# Patient Record
Sex: Female | Born: 1945 | Race: White | Hispanic: No
Health system: Southern US, Community
[De-identification: ages and names within clinical notes are randomized; demographics above are authoritative.]

---

## 2006-01-14 ENCOUNTER — Emergency Department: Payer: Self-pay | Admitting: Emergency Medicine

## 2007-04-30 ENCOUNTER — Emergency Department: Payer: Self-pay | Admitting: Emergency Medicine

## 2007-10-14 ENCOUNTER — Ambulatory Visit: Payer: Self-pay | Admitting: Family Medicine

## 2010-02-14 ENCOUNTER — Ambulatory Visit: Payer: Self-pay | Admitting: Family Medicine

## 2010-02-21 ENCOUNTER — Ambulatory Visit: Payer: Self-pay | Admitting: Family Medicine

## 2010-05-16 ENCOUNTER — Ambulatory Visit: Payer: Self-pay | Admitting: Gastroenterology

## 2010-09-30 ENCOUNTER — Ambulatory Visit: Payer: Self-pay

## 2011-03-16 ENCOUNTER — Ambulatory Visit: Payer: Self-pay | Admitting: Family Medicine

## 2011-04-13 ENCOUNTER — Ambulatory Visit: Payer: Self-pay | Admitting: Family Medicine

## 2011-10-17 ENCOUNTER — Emergency Department: Payer: Self-pay | Admitting: Emergency Medicine

## 2011-10-18 ENCOUNTER — Emergency Department: Payer: Self-pay | Admitting: Unknown Physician Specialty

## 2012-02-11 ENCOUNTER — Inpatient Hospital Stay: Payer: Self-pay | Admitting: Internal Medicine

## 2012-02-11 LAB — LIPASE, BLOOD: Lipase: 1297 U/L — ABNORMAL HIGH (ref 73–393)

## 2012-02-11 LAB — URINALYSIS, COMPLETE
Bacteria: NONE SEEN
Glucose,UR: NEGATIVE mg/dL (ref 0–75)
Ketone: NEGATIVE
Leukocyte Esterase: NEGATIVE
Nitrite: NEGATIVE
Protein: NEGATIVE
RBC,UR: NONE SEEN /HPF (ref 0–5)
Specific Gravity: 1.004 (ref 1.003–1.030)
WBC UR: <1 /HPF (ref 0–5)

## 2012-02-11 LAB — CBC
HCT: 34.4 % — ABNORMAL LOW (ref 35.0–47.0)
MCV: 99 fL (ref 80–100)
Platelet: 338 10*3/uL (ref 150–440)
RBC: 3.48 10*6/uL — ABNORMAL LOW (ref 3.80–5.20)
RDW: 18.8 % — ABNORMAL HIGH (ref 11.5–14.5)

## 2012-02-11 LAB — COMPREHENSIVE METABOLIC PANEL
Albumin: 3.7 g/dL (ref 3.4–5.0)
Alkaline Phosphatase: 72 U/L (ref 50–136)
Co2: 26 mmol/L (ref 21–32)
Creatinine: 1.01 mg/dL (ref 0.60–1.30)
EGFR (African American): >60
Osmolality: 288 (ref 275–301)
SGOT(AST): 25 U/L (ref 15–37)
SGPT (ALT): 23 U/L
Total Protein: 7.1 g/dL (ref 6.4–8.2)

## 2012-02-11 LAB — TROPONIN I: Troponin-I: 0.04 ng/mL

## 2012-02-12 LAB — BASIC METABOLIC PANEL
Calcium, Total: 8.2 mg/dL — ABNORMAL LOW (ref 8.5–10.1)
Chloride: 108 mmol/L — ABNORMAL HIGH (ref 98–107)
Co2: 23 mmol/L (ref 21–32)
Creatinine: 1.15 mg/dL (ref 0.60–1.30)
EGFR (Non-African Amer.): 50 — ABNORMAL LOW
Glucose: 83 mg/dL (ref 65–99)
Potassium: 3.5 mmol/L (ref 3.5–5.1)
Sodium: 145 mmol/L (ref 136–145)

## 2012-02-12 LAB — CBC WITH DIFFERENTIAL/PLATELET
Basophil %: 0.1 %
Eosinophil #: 0.1 10*3/uL (ref 0.0–0.7)
HCT: 26.9 % — ABNORMAL LOW (ref 35.0–47.0)
HGB: 9 g/dL — ABNORMAL LOW (ref 12.0–16.0)
Lymphocyte #: 2 10*3/uL (ref 1.0–3.6)
MCH: 33.5 pg (ref 26.0–34.0)
MCHC: 33.5 g/dL (ref 32.0–36.0)
MCV: 100 fL (ref 80–100)
Monocyte #: 0.7 10*3/uL (ref 0.0–0.7)
Neutrophil #: 5.9 10*3/uL (ref 1.4–6.5)
WBC: 8.8 10*3/uL (ref 3.6–11.0)

## 2012-02-12 LAB — HEMOGLOBIN A1C: Hemoglobin A1C: 6.5 % — ABNORMAL HIGH (ref 4.2–6.3)

## 2012-02-12 LAB — LIPID PANEL
HDL Cholesterol: 29 mg/dL — ABNORMAL LOW (ref 40–60)
Ldl Cholesterol, Calc: 36 mg/dL (ref 0–100)
VLDL Cholesterol, Calc: 36 mg/dL (ref 5–40)

## 2012-02-12 LAB — MAGNESIUM: Magnesium: 0.8 mg/dL — ABNORMAL LOW

## 2012-02-13 LAB — COMPREHENSIVE METABOLIC PANEL
Albumin: 2.4 g/dL — ABNORMAL LOW (ref 3.4–5.0)
Alkaline Phosphatase: 51 U/L (ref 50–136)
Anion Gap: 13 (ref 7–16)
BUN: 17 mg/dL (ref 7–18)
Calcium, Total: 8 mg/dL — ABNORMAL LOW (ref 8.5–10.1)
Creatinine: 1.78 mg/dL — ABNORMAL HIGH (ref 0.60–1.30)
Glucose: 65 mg/dL (ref 65–99)
Osmolality: 283 (ref 275–301)
Potassium: 4 mmol/L (ref 3.5–5.1)
SGOT(AST): 41 U/L — ABNORMAL HIGH (ref 15–37)
SGPT (ALT): 19 U/L
Sodium: 142 mmol/L (ref 136–145)
Total Protein: 4.9 g/dL — ABNORMAL LOW (ref 6.4–8.2)

## 2012-02-14 LAB — CBC WITH DIFFERENTIAL/PLATELET
Basophil %: 0.3 %
Eosinophil #: 0.1 10*3/uL (ref 0.0–0.7)
Eosinophil %: 1.4 %
HCT: 23.3 % — ABNORMAL LOW (ref 35.0–47.0)
HGB: 7.7 g/dL — ABNORMAL LOW (ref 12.0–16.0)
Lymphocyte #: 1.2 10*3/uL (ref 1.0–3.6)
MCHC: 32.9 g/dL (ref 32.0–36.0)
MCV: 102 fL — ABNORMAL HIGH (ref 80–100)
Monocyte #: 0.2 10*3/uL (ref 0.0–0.7)
Neutrophil #: 7.8 10*3/uL — ABNORMAL HIGH (ref 1.4–6.5)
RBC: 2.28 10*6/uL — ABNORMAL LOW (ref 3.80–5.20)
WBC: 9.4 10*3/uL (ref 3.6–11.0)

## 2012-02-14 LAB — COMPREHENSIVE METABOLIC PANEL
Albumin: 2.1 g/dL — ABNORMAL LOW (ref 3.4–5.0)
Anion Gap: 17 — ABNORMAL HIGH (ref 7–16)
BUN: 18 mg/dL (ref 7–18)
Bilirubin,Total: 0.7 mg/dL (ref 0.2–1.0)
Calcium, Total: 7.6 mg/dL — ABNORMAL LOW (ref 8.5–10.1)
Chloride: 110 mmol/L — ABNORMAL HIGH (ref 98–107)
Creatinine: 1.5 mg/dL — ABNORMAL HIGH (ref 0.60–1.30)
EGFR (African American): 45 — ABNORMAL LOW
Osmolality: 285 (ref 275–301)
SGOT(AST): 39 U/L — ABNORMAL HIGH (ref 15–37)
SGPT (ALT): 22 U/L

## 2012-02-14 LAB — LIPASE, BLOOD: Lipase: 253 U/L (ref 73–393)

## 2012-02-15 LAB — BASIC METABOLIC PANEL
Anion Gap: 10 (ref 7–16)
Calcium, Total: 7.9 mg/dL — ABNORMAL LOW (ref 8.5–10.1)
Chloride: 113 mmol/L — ABNORMAL HIGH (ref 98–107)
Co2: 17 mmol/L — ABNORMAL LOW (ref 21–32)
EGFR (Non-African Amer.): >60
Glucose: 79 mg/dL (ref 65–99)
Osmolality: 279 (ref 275–301)
Potassium: 3.7 mmol/L (ref 3.5–5.1)

## 2012-02-15 LAB — CBC WITH DIFFERENTIAL/PLATELET
Basophil %: 0.4 %
Eosinophil %: 3.6 %
HCT: 25 % — ABNORMAL LOW (ref 35.0–47.0)
Lymphocyte #: 0.7 10*3/uL — ABNORMAL LOW (ref 1.0–3.6)
MCH: 32.6 pg (ref 26.0–34.0)
MCHC: 33.7 g/dL (ref 32.0–36.0)
MCV: 97 fL (ref 80–100)
Neutrophil %: 83.6 %
Platelet: 178 10*3/uL (ref 150–440)
RBC: 2.58 10*6/uL — ABNORMAL LOW (ref 3.80–5.20)

## 2012-02-16 LAB — LIPASE, BLOOD: Lipase: 64 U/L — ABNORMAL LOW (ref 73–393)

## 2012-02-17 LAB — BASIC METABOLIC PANEL
Anion Gap: 16 (ref 7–16)
Calcium, Total: 8.4 mg/dL — ABNORMAL LOW (ref 8.5–10.1)
Chloride: 110 mmol/L — ABNORMAL HIGH (ref 98–107)
Co2: 17 mmol/L — ABNORMAL LOW (ref 21–32)
Creatinine: 0.91 mg/dL (ref 0.60–1.30)
EGFR (African American): >60
EGFR (Non-African Amer.): >60
Osmolality: 288 (ref 275–301)
Potassium: 4.3 mmol/L (ref 3.5–5.1)

## 2012-02-17 LAB — HEMOGLOBIN: HGB: 9.6 g/dL — ABNORMAL LOW (ref 12.0–16.0)

## 2012-02-17 LAB — TROPONIN I: Troponin-I: 0.06 ng/mL — ABNORMAL HIGH

## 2012-02-18 LAB — CBC WITH DIFFERENTIAL/PLATELET
Basophil #: 0 10*3/uL (ref 0.0–0.1)
Eosinophil #: 0 10*3/uL (ref 0.0–0.7)
HCT: 26.4 % — ABNORMAL LOW (ref 35.0–47.0)
Lymphocyte #: 0.9 10*3/uL — ABNORMAL LOW (ref 1.0–3.6)
Lymphocyte %: 10.8 %
MCHC: 34.4 g/dL (ref 32.0–36.0)
Monocyte #: 0.8 10*3/uL — ABNORMAL HIGH (ref 0.0–0.7)
Monocyte %: 10.3 %
Neutrophil #: 6.5 10*3/uL (ref 1.4–6.5)
Neutrophil %: 78.6 %
Platelet: 239 10*3/uL (ref 150–440)
RDW: 21.9 % — ABNORMAL HIGH (ref 11.5–14.5)
WBC: 8.3 10*3/uL (ref 3.6–11.0)

## 2012-02-18 LAB — COMPREHENSIVE METABOLIC PANEL
Albumin: 2.2 g/dL — ABNORMAL LOW (ref 3.4–5.0)
Alkaline Phosphatase: 55 U/L (ref 50–136)
Anion Gap: 17 — ABNORMAL HIGH (ref 7–16)
BUN: 19 mg/dL — ABNORMAL HIGH (ref 7–18)
Bilirubin,Total: 0.4 mg/dL (ref 0.2–1.0)
Calcium, Total: 8.2 mg/dL — ABNORMAL LOW (ref 8.5–10.1)
Chloride: 110 mmol/L — ABNORMAL HIGH (ref 98–107)
Creatinine: 1 mg/dL (ref 0.60–1.30)
EGFR (African American): >60
EGFR (Non-African Amer.): 59 — ABNORMAL LOW
Glucose: 92 mg/dL (ref 65–99)
Potassium: 3.7 mmol/L (ref 3.5–5.1)
SGOT(AST): 26 U/L (ref 15–37)
Total Protein: 5.4 g/dL — ABNORMAL LOW (ref 6.4–8.2)

## 2012-02-18 LAB — APTT: Activated PTT: 131.8 secs — ABNORMAL HIGH (ref 23.6–35.9)

## 2012-02-18 LAB — CK-MB: CK-MB: 1 ng/mL (ref 0.5–3.6)

## 2012-02-19 LAB — MRSA PCR SCREENING

## 2012-02-19 LAB — OCCULT BLOOD X 1 CARD TO LAB, STOOL: Occult Blood, Feces: NEGATIVE

## 2012-02-20 LAB — LIPASE, BLOOD: Lipase: 238 U/L (ref 73–393)

## 2012-02-21 LAB — CBC WITH DIFFERENTIAL/PLATELET
Basophil #: 0 10*3/uL (ref 0.0–0.1)
Basophil %: 0.3 %
Eosinophil %: 0 %
HCT: 26.7 % — ABNORMAL LOW (ref 35.0–47.0)
HGB: 9 g/dL — ABNORMAL LOW (ref 12.0–16.0)
Lymphocyte #: 1.3 10*3/uL (ref 1.0–3.6)
Lymphocyte %: 16.2 %
MCH: 32.3 pg (ref 26.0–34.0)
MCV: 96 fL (ref 80–100)
Monocyte %: 1.6 %
Neutrophil #: 6.7 10*3/uL — ABNORMAL HIGH (ref 1.4–6.5)
RBC: 2.8 10*6/uL — ABNORMAL LOW (ref 3.80–5.20)
WBC: 8.2 10*3/uL (ref 3.6–11.0)

## 2012-02-21 LAB — BASIC METABOLIC PANEL
Anion Gap: 10 (ref 7–16)
Calcium, Total: 7.8 mg/dL — ABNORMAL LOW (ref 8.5–10.1)
Chloride: 107 mmol/L (ref 98–107)
Co2: 23 mmol/L (ref 21–32)
EGFR (African American): >60
EGFR (Non-African Amer.): 55 — ABNORMAL LOW
Glucose: 109 mg/dL — ABNORMAL HIGH (ref 65–99)
Sodium: 140 mmol/L (ref 136–145)

## 2012-02-21 LAB — LIPASE, BLOOD: Lipase: 198 U/L (ref 73–393)

## 2012-03-18 ENCOUNTER — Ambulatory Visit: Payer: Self-pay | Admitting: Internal Medicine

## 2012-03-19 LAB — COMPREHENSIVE METABOLIC PANEL
Alkaline Phosphatase: 88 U/L (ref 50–136)
Anion Gap: 14 (ref 7–16)
BUN: 92 mg/dL — ABNORMAL HIGH (ref 7–18)
Bilirubin,Total: 0.5 mg/dL (ref 0.2–1.0)
Co2: 21 mmol/L (ref 21–32)
Glucose: 103 mg/dL — ABNORMAL HIGH (ref 65–99)
Osmolality: 315 (ref 275–301)
Potassium: 5.4 mmol/L — ABNORMAL HIGH (ref 3.5–5.1)
Sodium: 144 mmol/L (ref 136–145)
Total Protein: 6.8 g/dL (ref 6.4–8.2)

## 2012-03-19 LAB — CBC
HCT: 25.5 % — ABNORMAL LOW (ref 35.0–47.0)
HGB: 8.6 g/dL — ABNORMAL LOW (ref 12.0–16.0)
MCH: 33.9 pg (ref 26.0–34.0)
MCV: 101 fL — ABNORMAL HIGH (ref 80–100)
Platelet: 253 10*3/uL (ref 150–440)
WBC: 9.3 10*3/uL (ref 3.6–11.0)

## 2012-03-19 LAB — TROPONIN I: Troponin-I: <0.02 ng/mL

## 2012-03-20 ENCOUNTER — Inpatient Hospital Stay: Payer: Self-pay | Admitting: Internal Medicine

## 2012-03-20 LAB — BASIC METABOLIC PANEL
Calcium, Total: 8.2 mg/dL — ABNORMAL LOW (ref 8.5–10.1)
Chloride: 111 mmol/L — ABNORMAL HIGH (ref 98–107)
Co2: 17 mmol/L — ABNORMAL LOW (ref 21–32)
Osmolality: 306 (ref 275–301)
Potassium: 4.7 mmol/L (ref 3.5–5.1)
Sodium: 141 mmol/L (ref 136–145)

## 2012-03-20 LAB — URINALYSIS, COMPLETE
Hyaline Cast: 13
Ph: 5 (ref 4.5–8.0)
Squamous Epithelial: <1

## 2012-03-20 LAB — CREATININE, SERUM
Creatinine: 2.48 mg/dL — ABNORMAL HIGH (ref 0.60–1.30)
EGFR (African American): 25 — ABNORMAL LOW
EGFR (Non-African Amer.): 21 — ABNORMAL LOW

## 2012-03-21 LAB — CBC WITH DIFFERENTIAL/PLATELET
Basophil #: 0 10*3/uL (ref 0.0–0.1)
Eosinophil #: 0.4 10*3/uL (ref 0.0–0.7)
Eosinophil %: 8 %
HCT: 17.9 % — ABNORMAL LOW (ref 35.0–47.0)
HCT: 30.6 % — ABNORMAL LOW (ref 35.0–47.0)
HGB: 5.9 g/dL — ABNORMAL LOW (ref 12.0–16.0)
HGB: 10.5 g/dL — ABNORMAL LOW (ref 12.0–16.0)
Lymphocyte #: 1.1 10*3/uL (ref 1.0–3.6)
Lymphocyte #: 1.8 10*3/uL (ref 1.0–3.6)
Lymphocyte %: 18 %
Lymphocyte %: 29.7 %
MCH: 33.3 pg (ref 26.0–34.0)
MCH: 33.5 pg (ref 26.0–34.0)
MCHC: 32.9 g/dL (ref 32.0–36.0)
MCHC: 34.3 g/dL (ref 32.0–36.0)
MCV: 101 fL — ABNORMAL HIGH (ref 80–100)
MCV: 98 fL (ref 80–100)
Monocyte #: 0.5 10*3/uL (ref 0.0–0.7)
Monocyte %: 6.2 %
Neutrophil #: 4.2 10*3/uL (ref 1.4–6.5)
Neutrophil #: 3.5 10*3/uL (ref 1.4–6.5)
Neutrophil %: 66 %
Neutrophil %: 57.2 %
Platelet: 189 10*3/uL (ref 150–440)
RBC: 1.77 10*6/uL — ABNORMAL LOW (ref 3.80–5.20)
RBC: 3.13 10*6/uL — ABNORMAL LOW (ref 3.80–5.20)
RDW: 26.8 % — ABNORMAL HIGH (ref 11.5–14.5)
RDW: 17.3 % — ABNORMAL HIGH (ref 11.5–14.5)
WBC: 6.1 10*3/uL (ref 3.6–11.0)

## 2012-03-21 LAB — BASIC METABOLIC PANEL
BUN: 58 mg/dL — ABNORMAL HIGH (ref 7–18)
Calcium, Total: 7.5 mg/dL — ABNORMAL LOW (ref 8.5–10.1)
Chloride: 109 mmol/L — ABNORMAL HIGH (ref 98–107)
Co2: 27 mmol/L (ref 21–32)
Creatinine: 1.92 mg/dL — ABNORMAL HIGH (ref 0.60–1.30)
EGFR (African American): 34 — ABNORMAL LOW
EGFR (Non-African Amer.): 28 — ABNORMAL LOW
Osmolality: 307 (ref 275–301)
Potassium: 3.6 mmol/L (ref 3.5–5.1)
Sodium: 145 mmol/L (ref 136–145)

## 2012-03-21 LAB — FIBRIN DEGRADATION PROD.(ARMC ONLY): Fibrin Degradation Prod.: <10 (ref 2.1–7.7)

## 2012-03-21 LAB — FERRITIN: Ferritin (ARMC): 650 ng/mL — ABNORMAL HIGH (ref 8–388)

## 2012-03-21 LAB — HEPATIC FUNCTION PANEL A (ARMC)
Albumin: 2 g/dL — ABNORMAL LOW (ref 3.4–5.0)
Bilirubin, Direct: 0.1 mg/dL (ref 0.00–0.20)
Bilirubin,Total: 0.4 mg/dL (ref 0.2–1.0)
SGPT (ALT): 14 U/L

## 2012-03-21 LAB — PROTIME-INR
INR: 1.2
Prothrombin Time: 16 secs — ABNORMAL HIGH (ref 11.5–14.7)

## 2012-03-21 LAB — FIBRINOGEN: Fibrinogen: 402 mg/dL (ref 210–470)

## 2012-03-22 LAB — BASIC METABOLIC PANEL
Anion Gap: 9 (ref 7–16)
Chloride: 110 mmol/L — ABNORMAL HIGH (ref 98–107)
Creatinine: 1.25 mg/dL (ref 0.60–1.30)
EGFR (African American): 56 — ABNORMAL LOW
EGFR (Non-African Amer.): 46 — ABNORMAL LOW
Osmolality: 306 (ref 275–301)
Potassium: 3.3 mmol/L — ABNORMAL LOW (ref 3.5–5.1)
Sodium: 148 mmol/L — ABNORMAL HIGH (ref 136–145)

## 2012-03-22 LAB — CBC WITH DIFFERENTIAL/PLATELET
Basophil #: 0 10*3/uL (ref 0.0–0.1)
Eosinophil %: 6.8 %
HCT: 32.7 % — ABNORMAL LOW (ref 35.0–47.0)
HGB: 11 g/dL — ABNORMAL LOW (ref 12.0–16.0)
Lymphocyte #: 1.4 10*3/uL (ref 1.0–3.6)
Monocyte %: 6.3 %
Neutrophil %: 62.9 %
Platelet: 137 10*3/uL — ABNORMAL LOW (ref 150–440)
RDW: 17.7 % — ABNORMAL HIGH (ref 11.5–14.5)
WBC: 5.9 10*3/uL (ref 3.6–11.0)

## 2012-03-22 LAB — IRON AND TIBC
Iron Bind.Cap.(Total): 103 ug/dL — ABNORMAL LOW (ref 250–450)
Iron Saturation: 97 %
Iron: 100 ug/dL (ref 50–170)
Unbound Iron-Bind.Cap.: 3 ug/dL

## 2012-03-22 LAB — URINE CULTURE

## 2012-03-23 ENCOUNTER — Ambulatory Visit: Payer: Self-pay | Admitting: Neurology

## 2012-03-23 LAB — CBC WITH DIFFERENTIAL/PLATELET
Basophil %: 0.2 %
Eosinophil #: 0.3 10*3/uL (ref 0.0–0.7)
Eosinophil %: 4.5 %
HCT: 31.3 % — ABNORMAL LOW (ref 35.0–47.0)
HGB: 10.7 g/dL — ABNORMAL LOW (ref 12.0–16.0)
Lymphocyte #: 0.9 10*3/uL — ABNORMAL LOW (ref 1.0–3.6)
Lymphocyte %: 13.8 %
MCV: 98 fL (ref 80–100)
Monocyte #: 0.5 10*3/uL (ref 0.0–0.7)
Monocyte %: 8 %
Neutrophil #: 5 10*3/uL (ref 1.4–6.5)
Neutrophil %: 73.5 %
Platelet: 120 10*3/uL — ABNORMAL LOW (ref 150–440)
RBC: 3.18 10*6/uL — ABNORMAL LOW (ref 3.80–5.20)

## 2012-03-23 LAB — BASIC METABOLIC PANEL
Anion Gap: 9 (ref 7–16)
Calcium, Total: 7.1 mg/dL — ABNORMAL LOW (ref 8.5–10.1)
Chloride: 115 mmol/L — ABNORMAL HIGH (ref 98–107)
Co2: 26 mmol/L (ref 21–32)
EGFR (African American): 50 — ABNORMAL LOW
Glucose: 160 mg/dL — ABNORMAL HIGH (ref 65–99)
Potassium: 3.2 mmol/L — ABNORMAL LOW (ref 3.5–5.1)

## 2012-03-24 LAB — BASIC METABOLIC PANEL
Anion Gap: 10 (ref 7–16)
BUN: 34 mg/dL — ABNORMAL HIGH (ref 7–18)
Calcium, Total: 7 mg/dL — CL (ref 8.5–10.1)
Chloride: 117 mmol/L — ABNORMAL HIGH (ref 98–107)
Chloride: 116 mmol/L — ABNORMAL HIGH (ref 98–107)
Co2: 23 mmol/L (ref 21–32)
Co2: 23 mmol/L (ref 21–32)
Creatinine: 1.8 mg/dL — ABNORMAL HIGH (ref 0.60–1.30)
EGFR (African American): 38 — ABNORMAL LOW
EGFR (African American): 36 — ABNORMAL LOW
Glucose: 100 mg/dL — ABNORMAL HIGH (ref 65–99)
Glucose: 131 mg/dL — ABNORMAL HIGH (ref 65–99)
Osmolality: 306 (ref 275–301)
Osmolality: 306 (ref 275–301)
Potassium: 3.2 mmol/L — ABNORMAL LOW (ref 3.5–5.1)
Sodium: 150 mmol/L — ABNORMAL HIGH (ref 136–145)
Sodium: 149 mmol/L — ABNORMAL HIGH (ref 136–145)

## 2012-03-24 LAB — CBC WITH DIFFERENTIAL/PLATELET
Basophil #: 0 10*3/uL (ref 0.0–0.1)
Basophil %: 0.5 %
Eosinophil #: 0.4 10*3/uL (ref 0.0–0.7)
Eosinophil %: 5.2 %
Lymphocyte #: 1.1 10*3/uL (ref 1.0–3.6)
Lymphocyte %: 13.4 %
MCHC: 33.9 g/dL (ref 32.0–36.0)
MCV: 99 fL (ref 80–100)
Monocyte #: 0.9 10*3/uL — ABNORMAL HIGH (ref 0.0–0.7)
Monocyte %: 10.7 %
Neutrophil #: 6 10*3/uL (ref 1.4–6.5)
Neutrophil %: 70.2 %
RBC: 3.12 10*6/uL — ABNORMAL LOW (ref 3.80–5.20)
WBC: 8.6 10*3/uL (ref 3.6–11.0)

## 2012-03-24 LAB — URINALYSIS, COMPLETE
Bilirubin,UR: NEGATIVE
Glucose,UR: 150 mg/dL (ref 0–75)
Ph: 6 (ref 4.5–8.0)
Specific Gravity: 1.014 (ref 1.003–1.030)
Squamous Epithelial: NONE SEEN

## 2012-03-24 LAB — POTASSIUM: Potassium: 3.8 mmol/L (ref 3.5–5.1)

## 2012-03-24 LAB — OCCULT BLOOD X 1 CARD TO LAB, STOOL: Occult Blood, Feces: NEGATIVE

## 2012-03-24 LAB — MAGNESIUM: Magnesium: 0.8 mg/dL — ABNORMAL LOW

## 2012-03-25 LAB — BASIC METABOLIC PANEL
Anion Gap: 10 (ref 7–16)
Calcium, Total: 7.4 mg/dL — ABNORMAL LOW (ref 8.5–10.1)
Chloride: 113 mmol/L — ABNORMAL HIGH (ref 98–107)
Co2: 20 mmol/L — ABNORMAL LOW (ref 21–32)
Creatinine: 1.88 mg/dL — ABNORMAL HIGH (ref 0.60–1.30)
EGFR (African American): 35 — ABNORMAL LOW
Glucose: 106 mg/dL — ABNORMAL HIGH (ref 65–99)
Sodium: 143 mmol/L (ref 136–145)

## 2012-03-25 LAB — CBC WITH DIFFERENTIAL/PLATELET
Basophil #: 0.1 10*3/uL (ref 0.0–0.1)
Eosinophil #: 0.8 10*3/uL — ABNORMAL HIGH (ref 0.0–0.7)
Eosinophil %: 7.2 %
HCT: 31.2 % — ABNORMAL LOW (ref 35.0–47.0)
Lymphocyte %: 12.4 %
MCH: 33.3 pg (ref 26.0–34.0)
MCHC: 33 g/dL (ref 32.0–36.0)
Monocyte #: 1.2 10*3/uL — ABNORMAL HIGH (ref 0.0–0.7)
Monocyte %: 10.1 %
Neutrophil #: 8.1 10*3/uL — ABNORMAL HIGH (ref 1.4–6.5)
Neutrophil %: 69.8 %
Platelet: 135 10*3/uL — ABNORMAL LOW (ref 150–440)
RBC: 3.1 10*6/uL — ABNORMAL LOW (ref 3.80–5.20)
RDW: 19.8 % — ABNORMAL HIGH (ref 11.5–14.5)
WBC: 11.5 10*3/uL — ABNORMAL HIGH (ref 3.6–11.0)

## 2012-03-25 LAB — CULTURE, BLOOD (SINGLE)

## 2012-03-26 LAB — BASIC METABOLIC PANEL
Chloride: 109 mmol/L — ABNORMAL HIGH (ref 98–107)
EGFR (Non-African Amer.): 23 — ABNORMAL LOW
Glucose: 104 mg/dL — ABNORMAL HIGH (ref 65–99)
Osmolality: 287 (ref 275–301)
Sodium: 140 mmol/L (ref 136–145)

## 2012-03-26 LAB — CBC WITH DIFFERENTIAL/PLATELET
Basophil %: 0.3 %
Eosinophil %: 0.1 %
HCT: 30.3 % — ABNORMAL LOW (ref 35.0–47.0)
Lymphocyte #: 1.3 10*3/uL (ref 1.0–3.6)
MCV: 100 fL (ref 80–100)
Monocyte %: 2.4 %
Neutrophil #: 8.4 10*3/uL — ABNORMAL HIGH (ref 1.4–6.5)
Neutrophil %: 84.5 %
Platelet: 126 10*3/uL — ABNORMAL LOW (ref 150–440)
RBC: 3.03 10*6/uL — ABNORMAL LOW (ref 3.80–5.20)
WBC: 9.9 10*3/uL (ref 3.6–11.0)

## 2012-03-26 LAB — URINE CULTURE

## 2012-03-26 LAB — MAGNESIUM: Magnesium: 1.5 mg/dL — ABNORMAL LOW

## 2012-03-26 LAB — POTASSIUM: Potassium: 5.5 mmol/L — ABNORMAL HIGH (ref 3.5–5.1)

## 2012-03-26 LAB — PHOSPHORUS: Phosphorus: 2.3 mg/dL — ABNORMAL LOW (ref 2.5–4.9)

## 2012-03-26 LAB — PROTEIN / CREATININE RATIO, URINE
Creatinine, Urine: 109.9 mg/dL (ref 30.0–125.0)
Protein, Random Urine: 276 mg/dL — ABNORMAL HIGH (ref 0–12)

## 2012-03-27 LAB — RENAL FUNCTION PANEL
Albumin: 1.8 g/dL — ABNORMAL LOW (ref 3.4–5.0)
Anion Gap: 10 (ref 7–16)
BUN: 37 mg/dL — ABNORMAL HIGH (ref 7–18)
Calcium, Total: 7.9 mg/dL — ABNORMAL LOW (ref 8.5–10.1)
Chloride: 104 mmol/L (ref 98–107)
Co2: 22 mmol/L (ref 21–32)
EGFR (African American): 25 — ABNORMAL LOW
EGFR (Non-African Amer.): 21 — ABNORMAL LOW
Glucose: 138 mg/dL — ABNORMAL HIGH (ref 65–99)
Osmolality: 283 (ref 275–301)
Sodium: 136 mmol/L (ref 136–145)

## 2012-03-27 LAB — MAGNESIUM: Magnesium: 2.1 mg/dL

## 2012-03-27 LAB — PHOSPHORUS: Phosphorus: 4.2 mg/dL (ref 2.5–4.9)

## 2012-03-28 LAB — CBC WITH DIFFERENTIAL/PLATELET
Basophil #: 0 10*3/uL (ref 0.0–0.1)
Basophil %: 0 %
MCHC: 33.5 g/dL (ref 32.0–36.0)
Monocyte #: 0.5 x10 3/mm (ref 0.2–0.9)
Neutrophil #: 10.2 10*3/uL — ABNORMAL HIGH (ref 1.4–6.5)
Platelet: 158 10*3/uL (ref 150–440)
RBC: 2.71 10*6/uL — ABNORMAL LOW (ref 3.80–5.20)
RDW: 19.2 % — ABNORMAL HIGH (ref 11.5–14.5)
WBC: 11.3 10*3/uL — ABNORMAL HIGH (ref 3.6–11.0)

## 2012-03-28 LAB — BASIC METABOLIC PANEL
Anion Gap: 10 (ref 7–16)
BUN: 42 mg/dL — ABNORMAL HIGH (ref 7–18)
Calcium, Total: 7.8 mg/dL — ABNORMAL LOW (ref 8.5–10.1)
Chloride: 103 mmol/L (ref 98–107)
Creatinine: 2.44 mg/dL — ABNORMAL HIGH (ref 0.60–1.30)
EGFR (African American): 26 — ABNORMAL LOW
EGFR (Non-African Amer.): 21 — ABNORMAL LOW
Osmolality: 287 (ref 275–301)
Potassium: 4.6 mmol/L (ref 3.5–5.1)
Sodium: 137 mmol/L (ref 136–145)

## 2012-04-17 ENCOUNTER — Ambulatory Visit: Payer: Self-pay | Admitting: Internal Medicine

## 2012-05-18 DEATH — deceased

## 2013-04-06 IMAGING — US ABDOMEN ULTRASOUND LIMITED
1 series · 17 of 20 positions shown · non-contrast
Comparison: none

REASON FOR EXAM: vomiting, increased lipase
COMMENTS:   Body Site: GB and Fossa, CBD, Head of Pancreas

PROCEDURE:     US  - US ABDOMEN LIMITED SURVEY  - February 11, 2012  [DATE]
RESULT:     Comparison: None.
TECHNIQUE: Multiple grayscale and color Doppler images were obtained of the
right upper quadrant.

[Series 1: abdomen ultrasound limited · 17 of 20 slices shown]
[im 1/20]
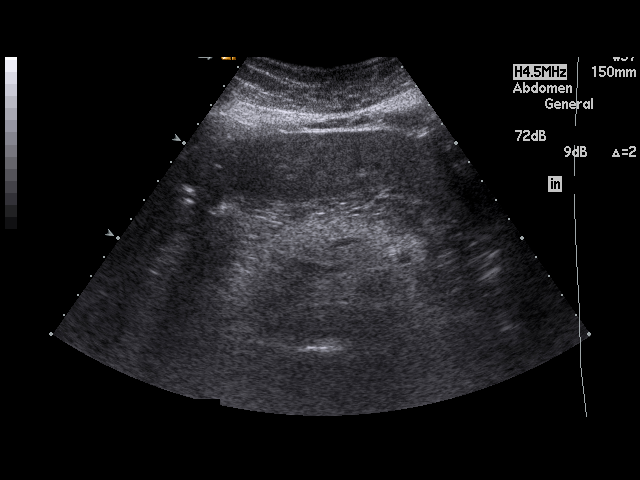
[im 2/20]
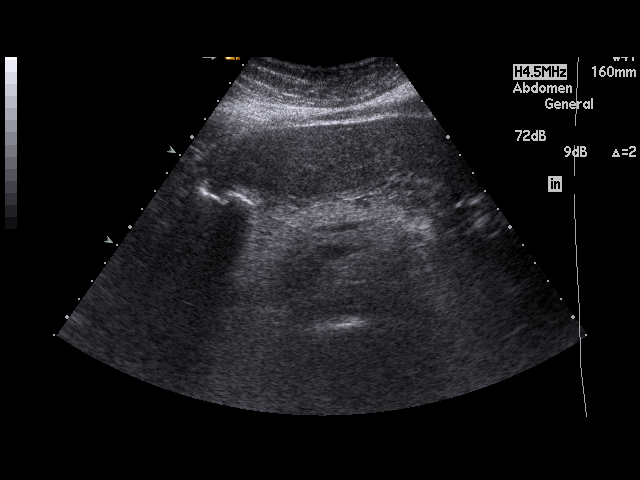
[im 3/20]
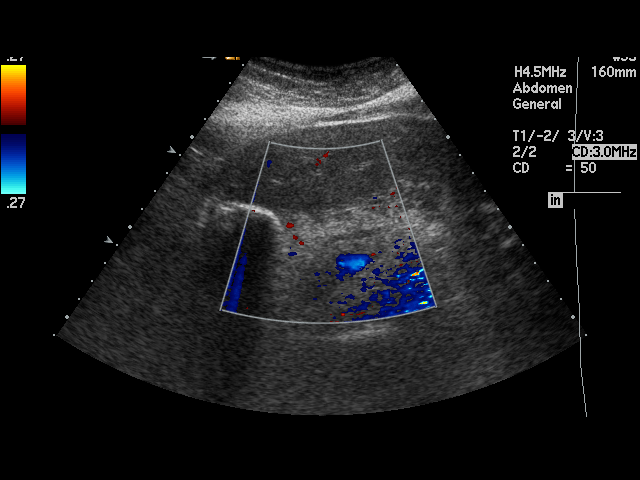
[im 5/20]
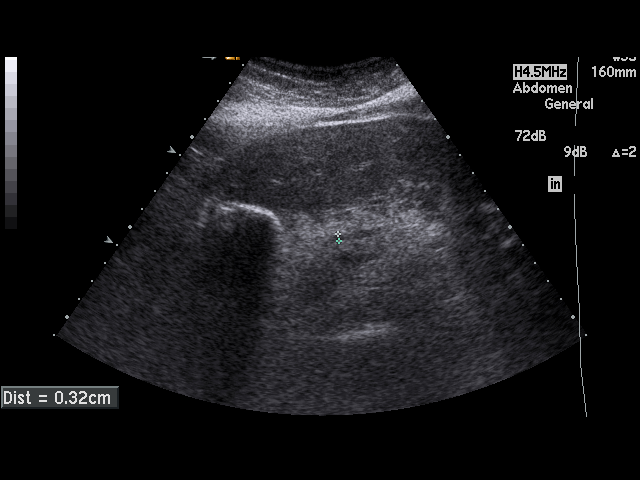
[im 6/20]
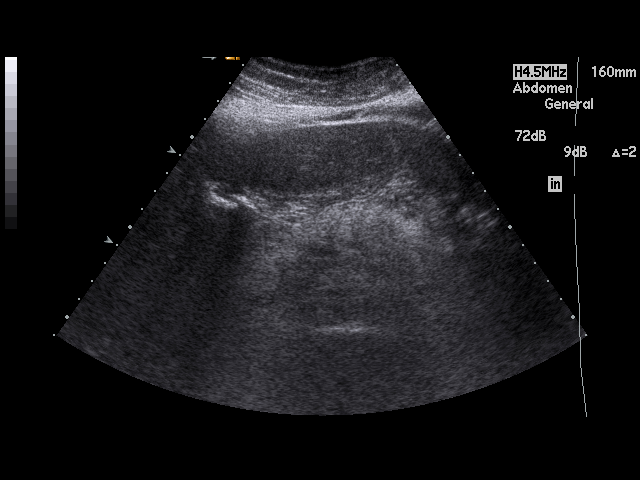
[im 7/20]
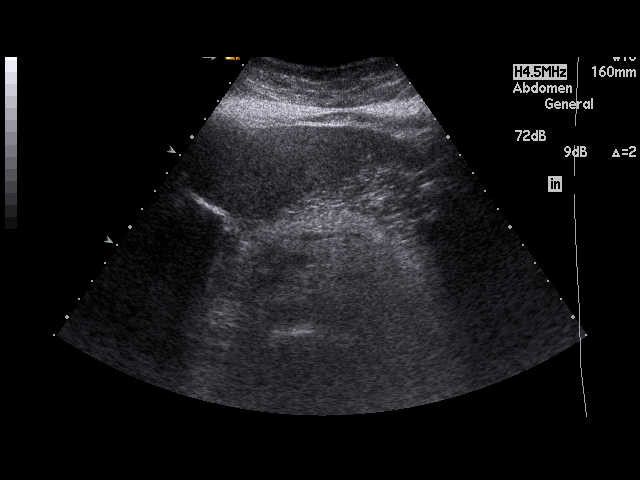
[im 8/20]
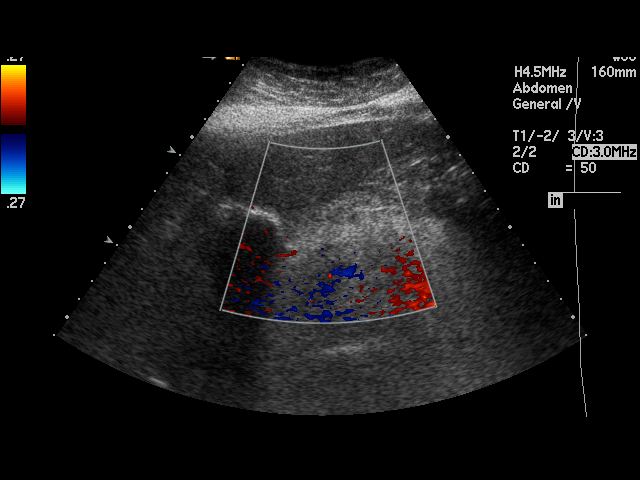
[im 9/20]
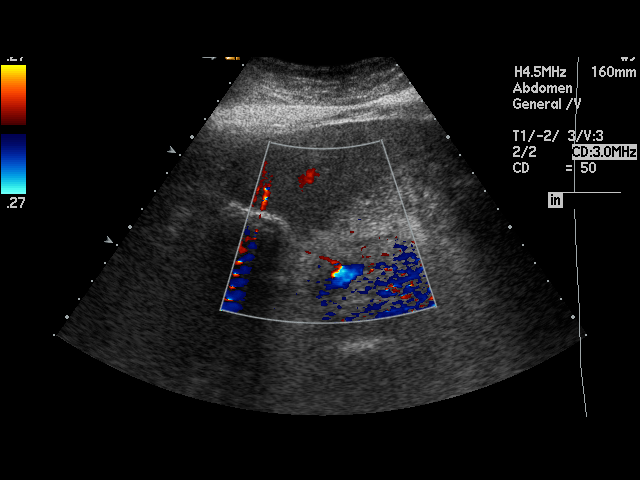
[im 11/20]
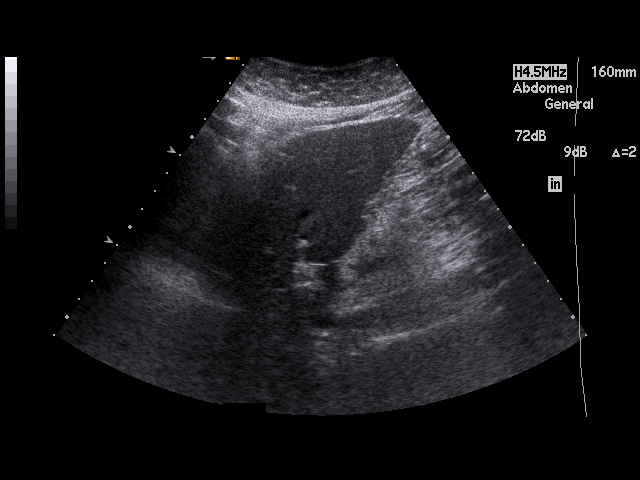
[im 12/20]
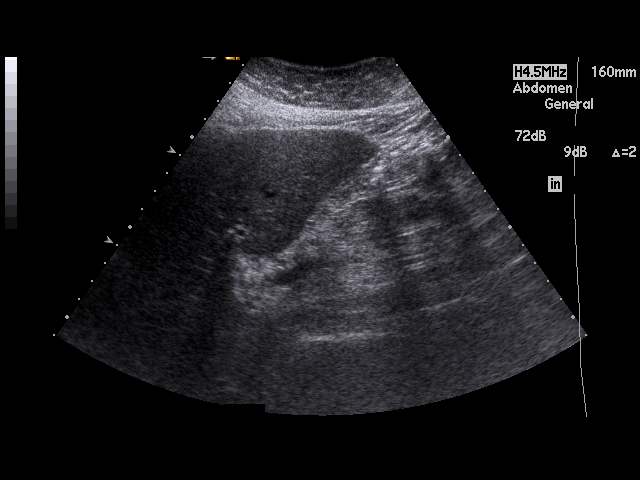
[im 13/20]
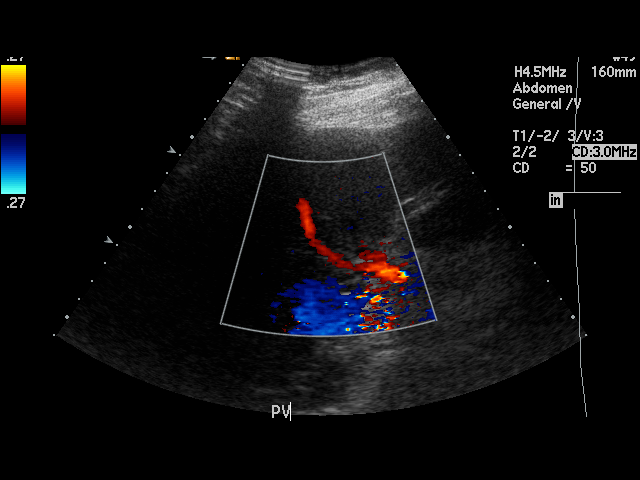
[im 14/20]
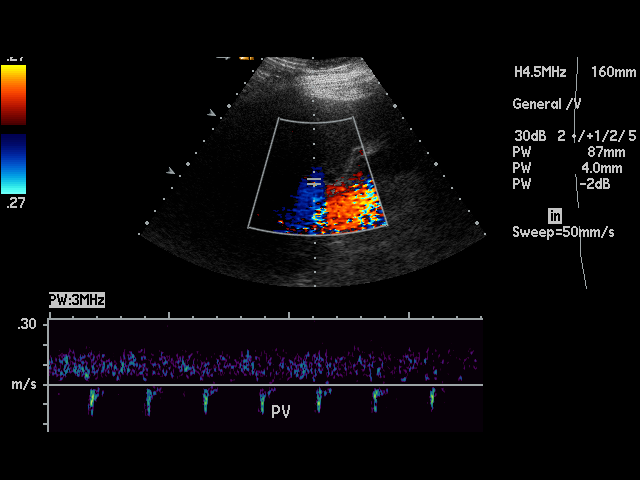
[im 15/20]
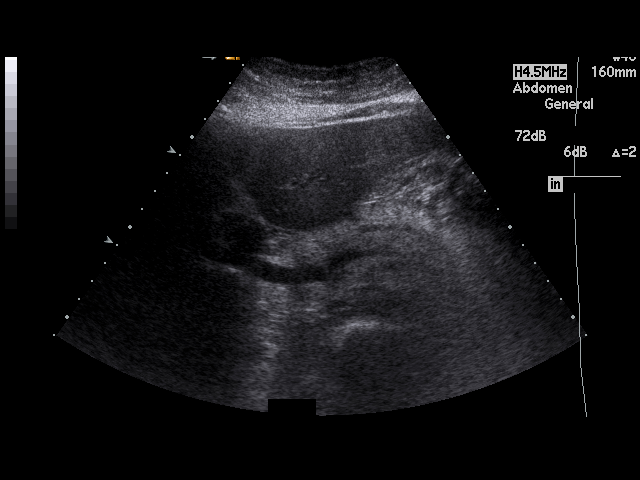
[im 16/20]
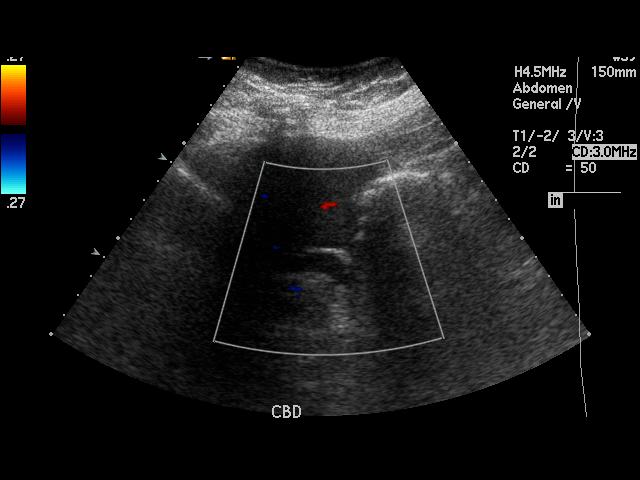
[im 18/20]
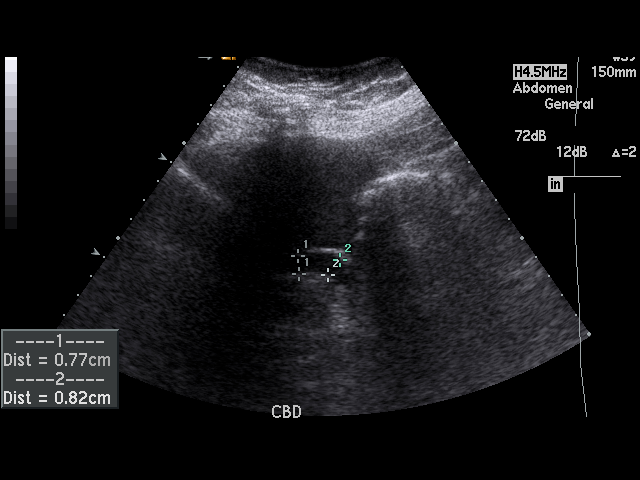
[im 19/20]
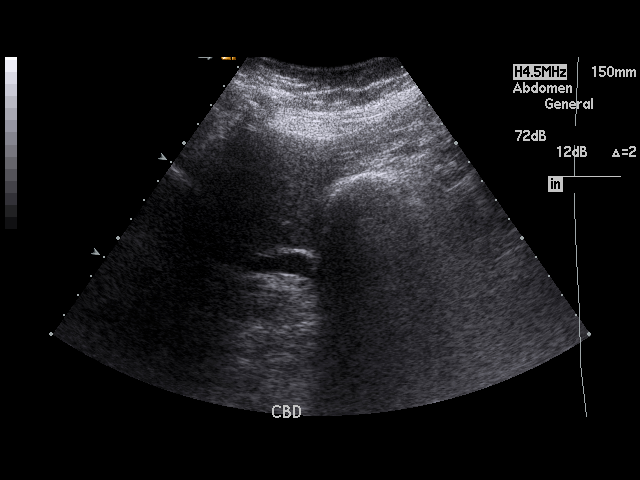
[im 20/20]
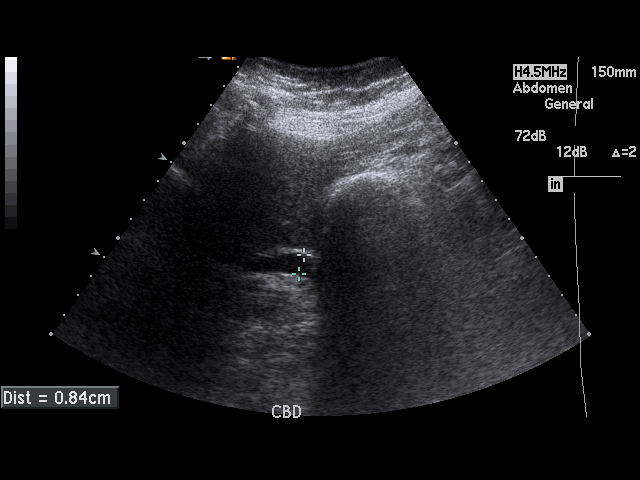

[17 of 20 positions shown; findings below may reference images not displayed]

FINDINGS: The pancreas is partially obscured by overlying bowel gas. However, the
visualized portion is relatively hyperechoic. The pancreatic duct is at the
upper limits of normal to mildly dilated, measuring 3 mm. The patient is
status post cholecystectomy. The common bile duct measures 8 mm which is
likely related to the prior cholecystectomy.
IMPRESSION: 1. The pancreas is only partially visualized, but the visualized portion is
relatively echogenic, suggesting pancreatitis. Correlate clinically.
2. The pancreatic duct is at the upper limits of normal to mildly dilated.
This is nonspecific, and may be related to pancreatitis.
3. Prior cholecystectomy. Mild dilatation of the common bile duct likely
related to prior cholecystectomy.

## 2013-04-06 IMAGING — CR DG ABDOMEN 3V
1 series · 4 of 4 positions shown · non-contrast
Comparison: none

REASON FOR EXAM: pain, vomiting
COMMENTS:

PROCEDURE:     DXR - DXR ABDOMEN 3-WAY (INCL PA CXR)  - February 11, 2012  [DATE]
RESULT:     Comparison: Chest radiograph [DATE]

[Series 1: ap · 0.17mm/px · 4 of 4 slices shown]
[im 1/4]
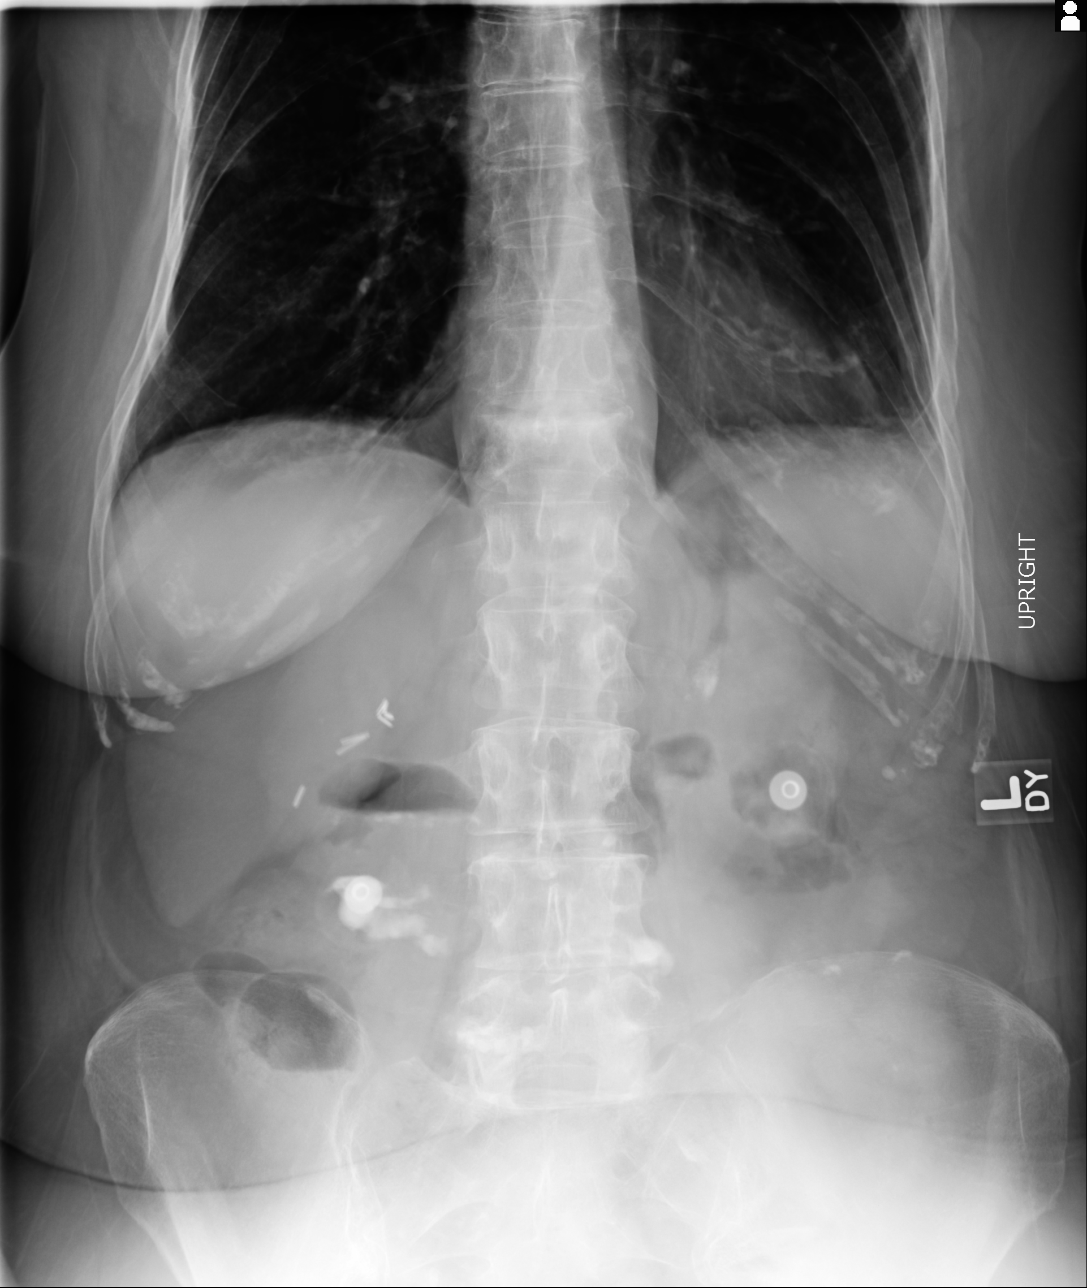
[im 2/4]
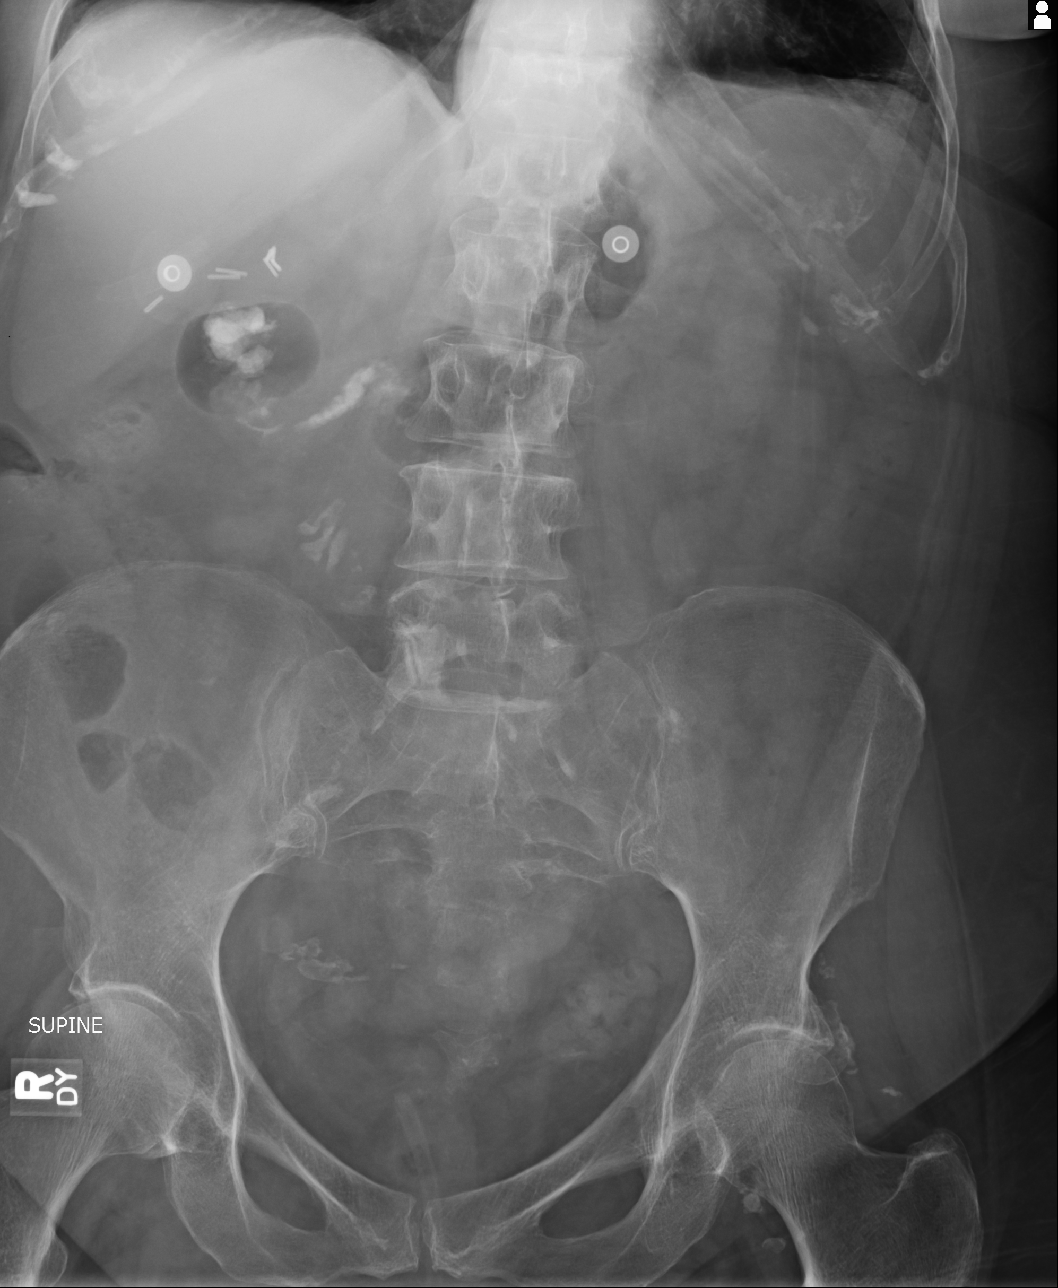
[im 3/4]
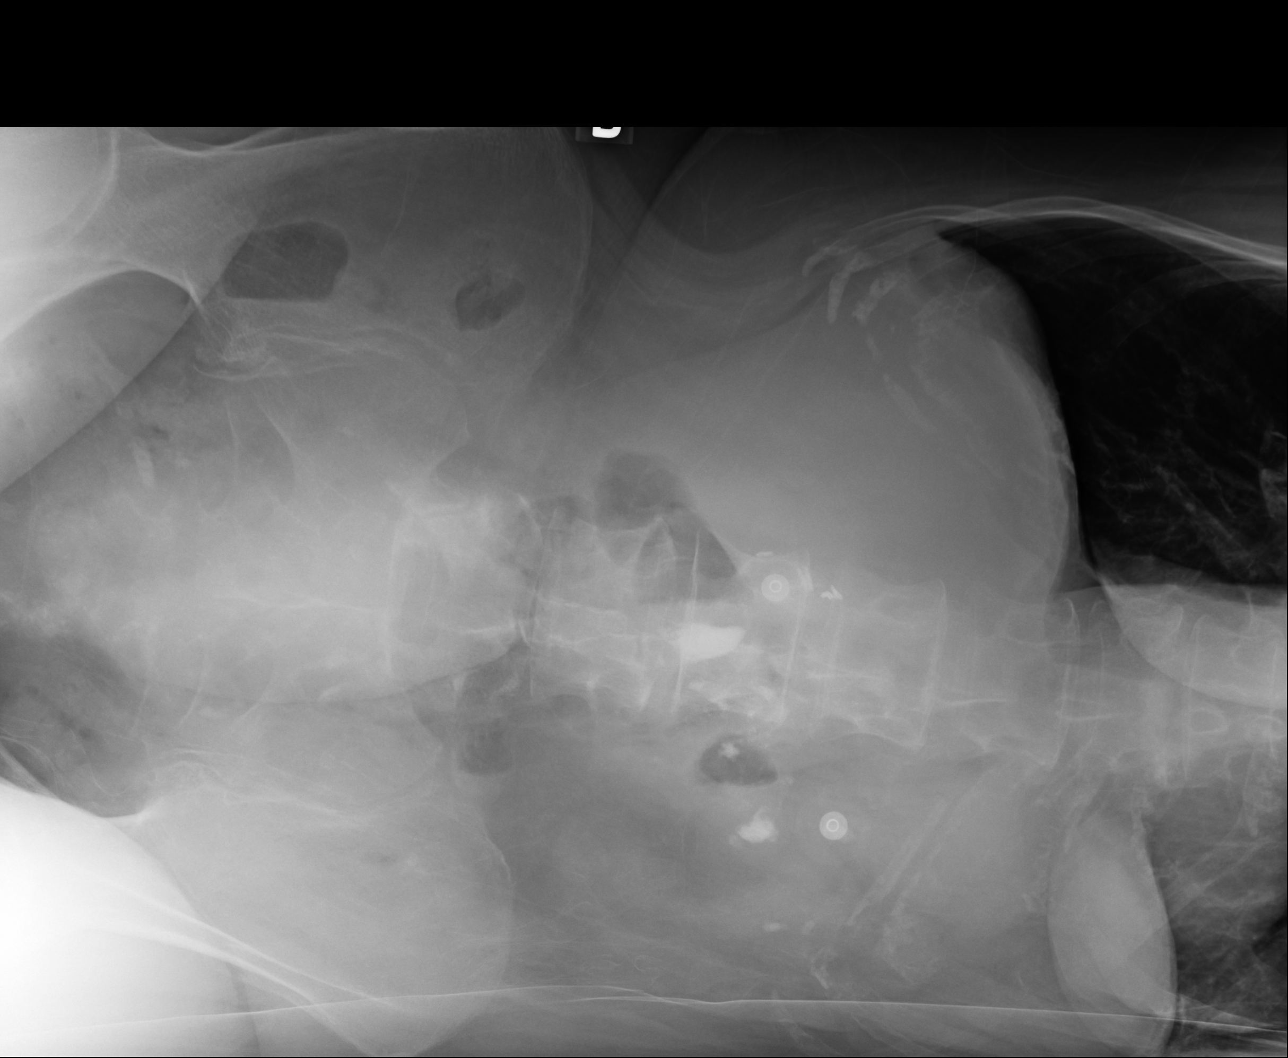
[im 4/4]
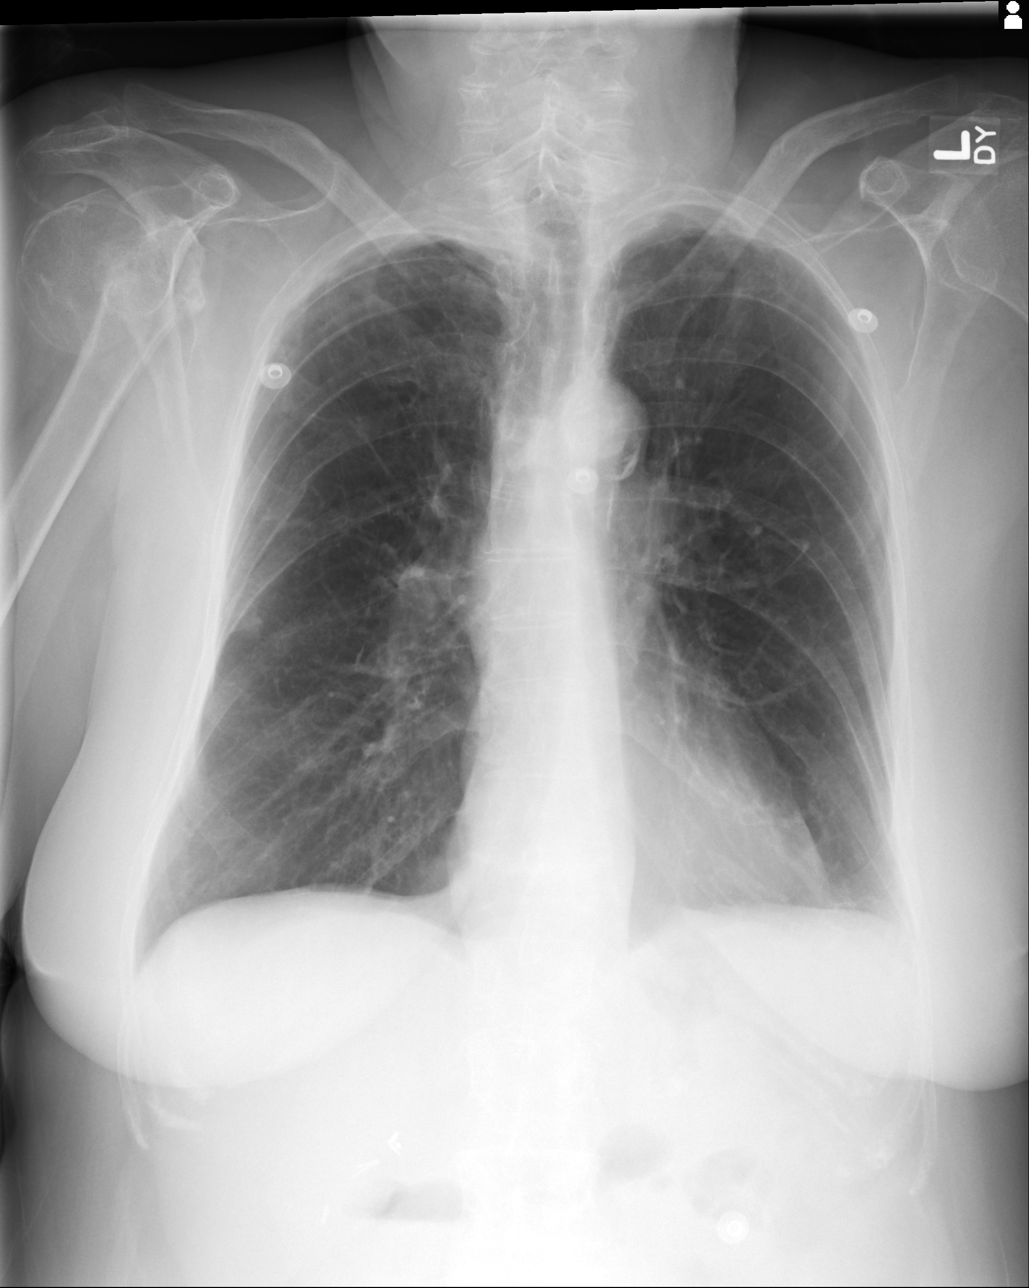

[4 of 4 positions shown; findings below may reference images not displayed]

FINDINGS: Heart and mediastinum are stable. Subtle density at the periphery of the
right midlung along the right minor fissure may represent atelectasis or
scarring. There is mild biapical pleural-parenchymal thickening. There is an
old right femoral head and neck fracture.

No free intraperitoneal air. There is a relative paucity of bowel gas, which
limits evaluation. However, no evidence of dilated loops of small bowel.
There is increased density in the right midabdomen may represent ingested
material within the duodenum and gastric antrum. Surgical clips seen from
prior cholecystectomy.
IMPRESSION: 1. Nonobstructed bowel gas pattern.
2. Subtle density at the periphery of the right mid lung may represent an
area of atelectasis or scarring. However, it demonstrates a slightly nodular
morphology and followup chest radiographs are recommended to ensure
resolution/stability.

## 2013-04-12 IMAGING — CR DG CHEST 1V PORT
1 series · 1 of 1 positions shown · non-contrast
Comparison: none

REASON FOR EXAM: dyspnea, tachycardia
COMMENTS:

PROCEDURE:     DXR - DXR PORTABLE CHEST SINGLE VIEW  - February 17, 2012  [DATE]
RESULT:
Mild right lower lobe infiltrate cannot be excluded. The cardiovascular
structures are unremarkable.

[portable]
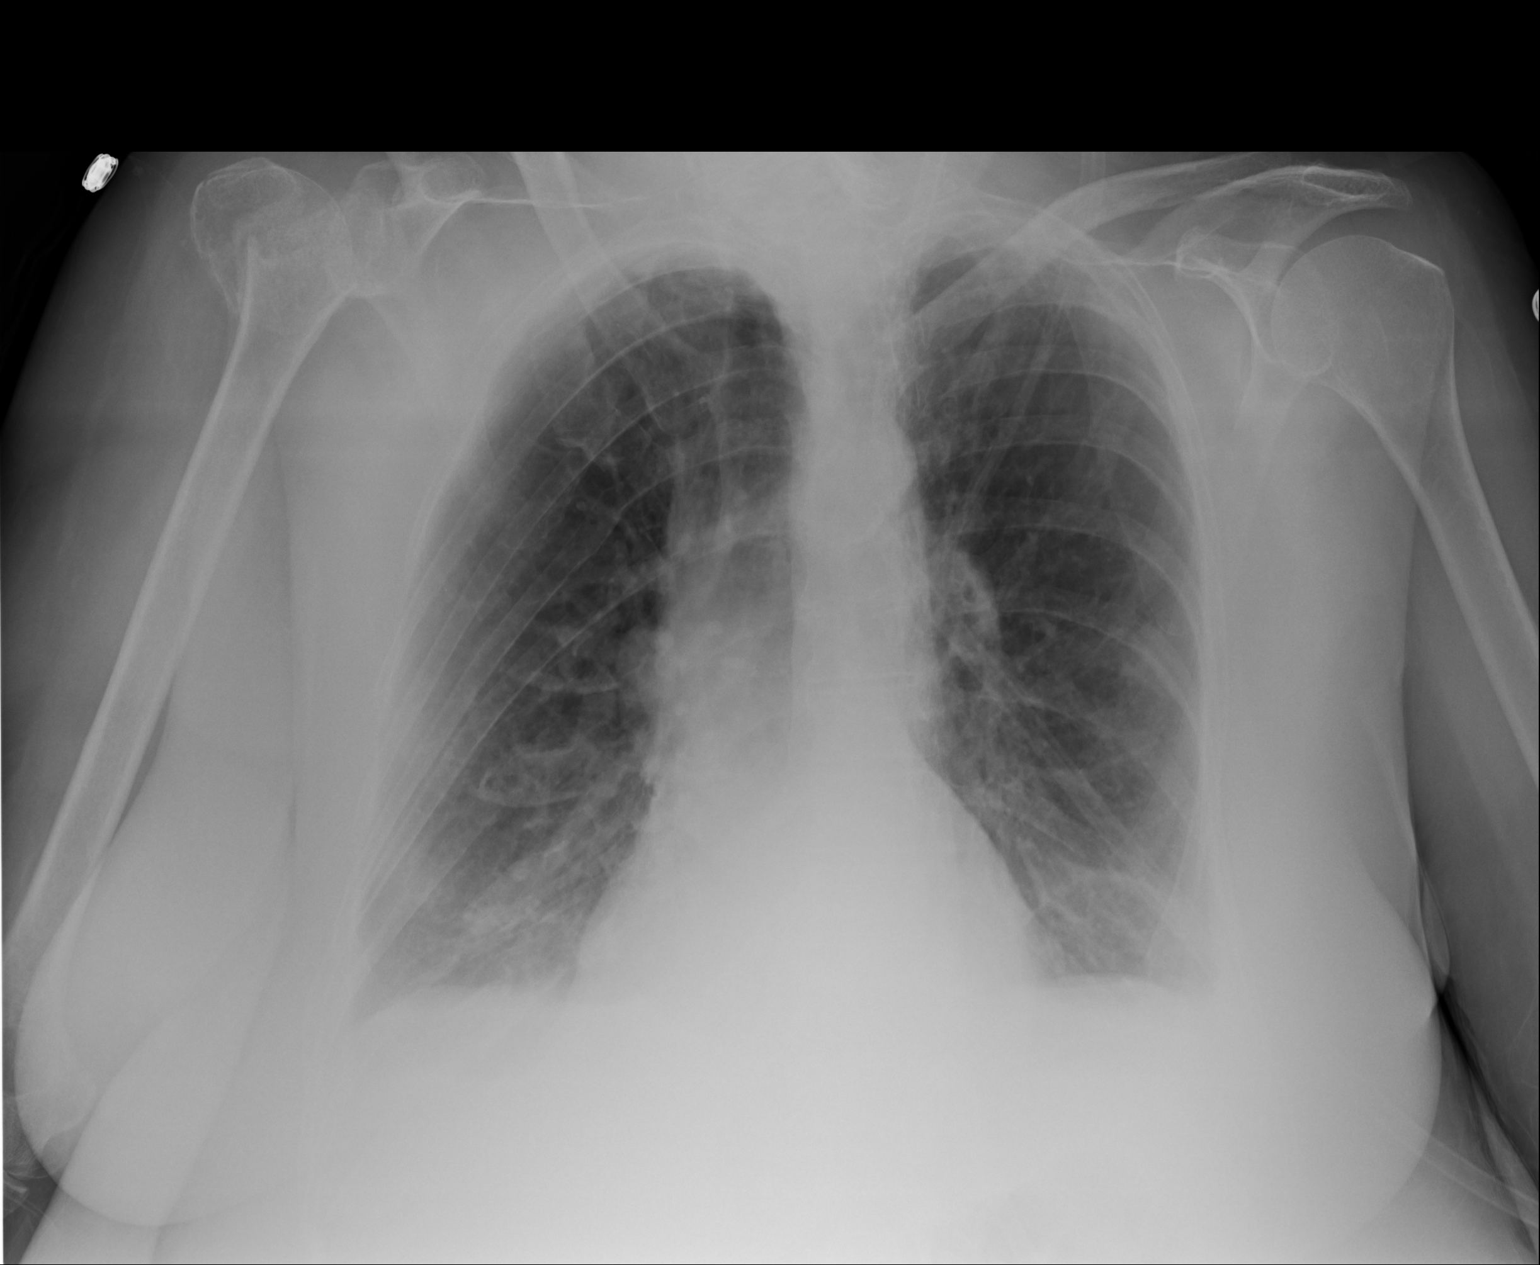

[1 of 1 positions shown; findings below may reference images not displayed]

IMPRESSION: Mild right lower lobe infiltrate consistent with pneumonia.

## 2013-05-13 IMAGING — CR DG CHEST 1V PORT
1 series · 1 of 1 positions shown · non-contrast
Comparison: none

REASON FOR EXAM: ams
COMMENTS:

[portable]
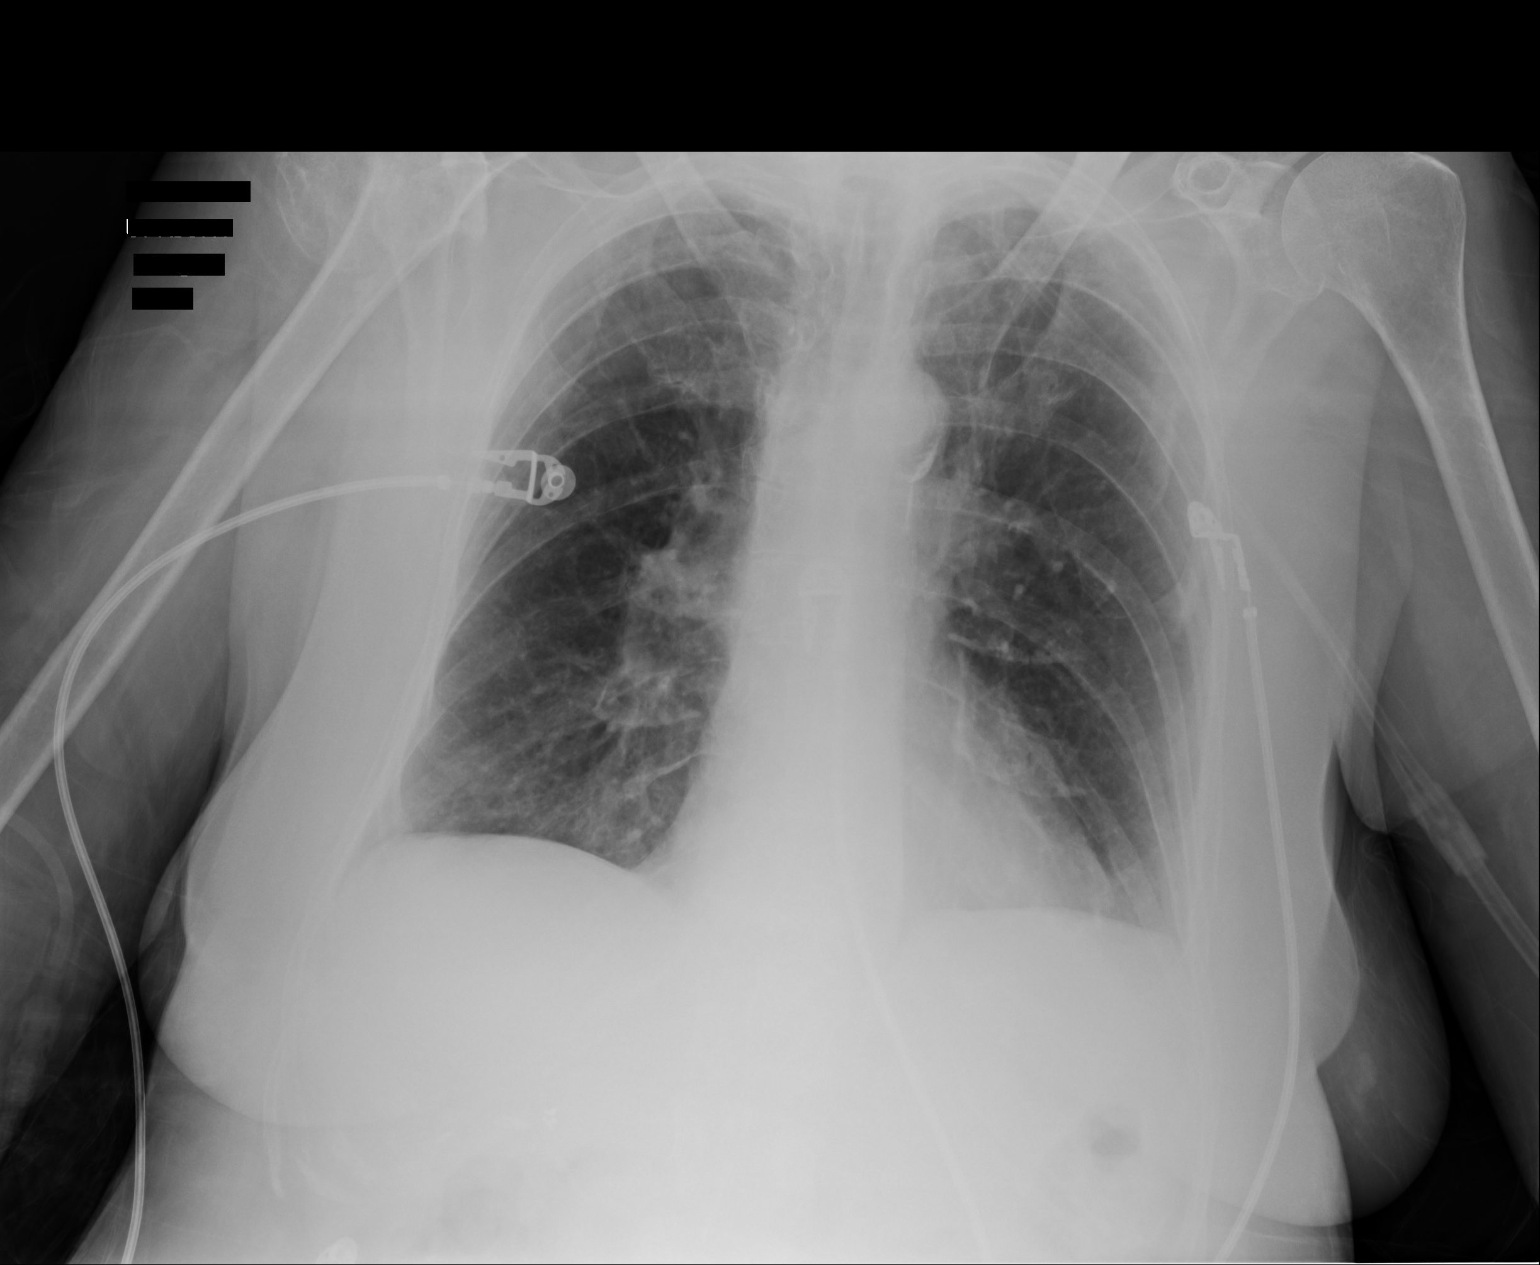

[1 of 1 positions shown; findings below may reference images not displayed]

PROCEDURE:     DXR - DXR PORTABLE CHEST SINGLE VIEW  - March 19, 2012 [DATE]

RESULT:     Comparison is made to the study February 17, 2012.

The lungs are adequately inflated. There is no focal infiltrate. The lower
lobe interstitial markings are less conspicuous. The cardiac silhouette is
top normal in size. The pulmonary vascularity is not engorged. There is
deformity of the humeral head and neck on the right which is unchanged from
the earlier study.
IMPRESSION: I do not see evidence of focal pneumonia. There is likely
underlying COPD. There is no overt evidence of CHF. A followup PA and
lateral chest x-ray would be of value if the patient has persistent
cardiopulmonary symptoms.

## 2013-05-14 IMAGING — CR DG CHEST 1V PORT
1 series · 1 of 1 positions shown · non-contrast
Comparison: none

REASON FOR EXAM: central line placement
COMMENTS:

[ap]
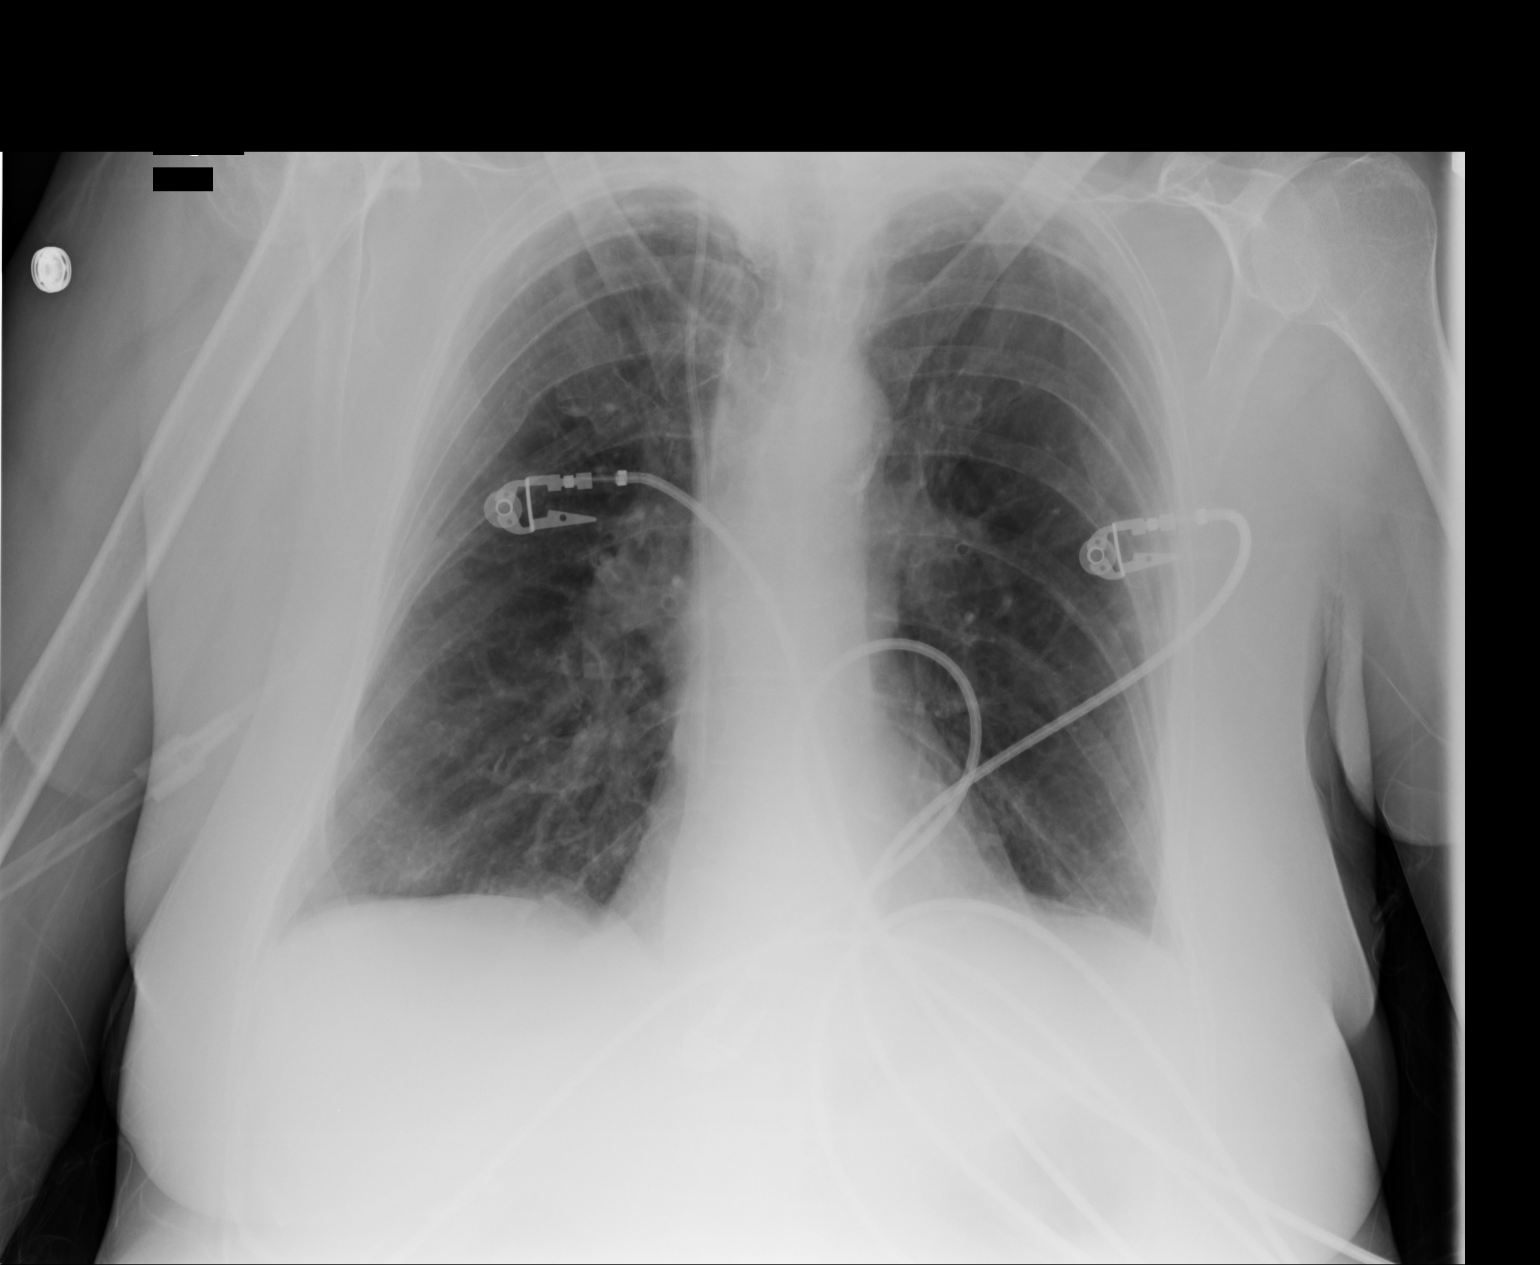

[1 of 1 positions shown; findings below may reference images not displayed]

PROCEDURE:     DXR - DXR PORTABLE CHEST SINGLE VIEW  - March 20, 2012  [DATE]

RESULT:     Comparison is made to a study March 19, 2012.

The lungs are well-expanded. The interstitial markings remain increased.
There is a catheter in place via the right internal jugular approach whose
tip lies in the region of the junction of the SVC with the right atrium. I
see no pneumothorax or other postprocedure complication.
IMPRESSION: I see no postprocedure complication following placement of
a right internal jugular venous catheter.

## 2015-04-11 NOTE — Consult Note (Signed)
Patient admitted with possible sepsis, hypotension, now needs pressors and has poor venous access.  Central line placed at bedside without difficulty  Electronic Signatures: Annice Needyew, Dahl Higinbotham S (MD)  (Signed on 03-Apr-13 16:05)  Authored  Last Updated: 03-Apr-13 16:05 by Annice Needyew, Jaymison Luber S (MD)

## 2015-04-11 NOTE — Consult Note (Signed)
General Aspect patient is a 69 year old female with no prior cardiac history who was admitted with abdominal pain. 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 She denies any exertional chest pain but complains of discomfort under her left breast at the left upper costal margin. She denies any exertional component. Is worse with deep palpation, deep breathing, coughing and moving from side to side. Her electrocardiogram has been unremarkable until yesterday when she developed tachycardia. This appeared to be supraventricular tachycardia with narrow complexes. She has a borderline serum troponin elevation of 0.07. There were no ischemic changes noted on the EKG or telemetry. She is currently resting comfortably in bed.  risk factors include family history, hypertension, tobacco abuse.  she was felt to have nonalcoholic pancreatitis as well as probable pneumonia.   Physical Exam:   GEN disheveled    HEENT PERRL    NECK supple    RESP clear BS    CARD Regular rate and rhythm  Tachycardic  Murmur    Murmur Systolic    ABD positive tenderness  normal BS    LYMPH negative neck    SKIN normal to palpation, skin turgor decreased    NEURO cranial nerves intact, motor/sensory function intact    PSYCH A+O to time, place, person   Review of Systems:   Subjective/Chief Complaint epigastric and left upper quadrant abdominal pain    General: No Complaints    Skin: No Complaints    ENT: No Complaints    Eyes: No Complaints    Neck: No Complaints    Respiratory: No Complaints    Cardiovascular: Palpitations    Gastrointestinal: No Complaints  Heartburn    Genitourinary: No Complaints    Vascular: No Complaints    Musculoskeletal: No Complaints    Neurologic: No Complaints    Hematologic: No Complaints    Endocrine: No Complaints    Psychiatric: No Complaints    Review of Systems: All other systems were reviewed and found to be negative    Medications/Allergies Reviewed  Medications/Allergies reviewed     multiple sclerosis: 2003   Fx humerus: 2012   Meningitis: Dec 1996   Asthma:    Emphysema:    Lap Chole: 2005   hysterectomy: 1983  EKG:   EKG NSR    Ibuprofen: GI Distress  Doxycycline: Unknown  Levaquin: Unknown    Impression patient is a 69 year old female with no prior cardiac history but history of tobacco abuse admitted with abdominal pain. 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 She was also noted on chest x-ray and CT scan to evidence of probable pneumonia. Her electrocardiogram initially was unremarkable. She did develop an episode of supraventricular tachycardia yesterday. This has resolved. She had no significant change in her hemodynamic status during this. It did not show evidence of injury or ischemia on telemetry her EKG. Her serum troponin is minimally elevated at 0.07. This appears be secondary to increased demand. She does not appear to be been having an acute coronary event.    Plan 1. Continue with aggressive treatment of her pancreatitis and probable pneumonia 2. Echocardiogram to evaluate for wall motion at about and to evaluate for evidence of structural disease to guide further therapy 3. Would not proceed with cardiac catheterization at this time unless her symptoms change and/or echocardiogram show significant abnormalities that would be further better evaluated with invasive cardiac evaluation 4. We'll follow with you   Electronic Signatures: Dalia Heading (MD)  (Signed 03-Mar-13 14:06)  Authored: General  Aspect/Present Illness, History and Physical Exam, Review of System, Past Medical History, EKG , Allergies, Impression/Plan   Last Updated: 03-Mar-13 14:06 by Dalia HeadingFath, Kenneth A (MD)

## 2015-04-11 NOTE — Consult Note (Signed)
No evidence of GI blood loss, stool heme neg per lab and my bedside test.  Previous colonoscopy done 2011 by Dr. Niel HummerIftikhar showed sigmoid diverticulosis and 3 small 8mm or smaller polyps.  Noted to have anemia on last hospitalization.  Suggest hematology consult to consider evaluation and possible bone marrow.  Recommend further transfusions as needed.  Will change oral to iv Protonix,  I recommend you start multivs in iv once a day.  Electronic Signatures: Scot JunElliott, Naliyah Neth T (MD)  (Signed on 04-Apr-13 13:43)  Authored  Last Updated: 04-Apr-13 13:43 by Scot JunElliott, Lubertha Leite T (MD)

## 2015-04-11 NOTE — Op Note (Signed)
PATIENT NAME:  [name], Sabrina [name]#:  [id number] DATE OF BIRTH:  [birth year]  DATE OF PROCEDURE:  03/22/2012  PREOPERATIVE DIAGNOSIS: Sepsis and need for central venous access due to inotropic therapy.   POSTOPERATIVE DIAGNOSIS: Sepsis and need for central venous access due to inotropic therapy.    PROCEDURE: Right femoral vein triple lumen catheter insertion.   SURGEON: Zylee Marchiano A. Egbert GaribaldiBird, M.D.   ANESTHESIA: Existing Precedex drip and 5 mL of 1% lidocaine.   INDICATIONS: The patient is a 69 year old white female admitted to the Intensive Care Unit on 04/03. Central line was placed at that time. Due to agitation she removed this internal jugular vein triple lumen catheter earlier this morning. Request has been made for further access. Due to her amount of agitation, I elected to place this in the right femoral position to make it more difficult for her to remove the line again.   DESCRIPTION OF PROCEDURE: With informed consent obtained from the family, she was positioned supine. The right groin was sterilely prepped and draped with ChloraPrep solution. Time-out procedure was observed. The course of the femoral artery on the right side was palpated and medial to this. 5 mL of 1% lidocaine was anesthetized into the subcutaneous tissues. On first pass a needle was placed into the vein with return of nonpulsatile venous-appearing blood. Wire was passed easily. The needle was removed. A small skin incision was fashioned over the wire with a #11 blade. 8 French percutaneous dilatation was then performed with the subcutaneous tract and vein. The catheter, having been previously flushed on the back table, was inserted and secured to 20 cm and all three ports flushed and aspirated without ifficulty. A sterile occlusive dressing was placed. The patient tolerated the procedure well and the line was available for immediate use.   ____________________________ Redge GainerMark A. Egbert GaribaldiBird, MD mab:ap D: 03/22/2012 16:35:05  ET T: 03/23/2012 10:43:07 ET JOB#: [id number]  cc: Loraine LericheMark A. Egbert GaribaldiBird, MD, <Dictator> Raynald KempMARK A Ciena Sampley MD ELECTRONICALLY SIGNED 03/23/2012 16:29

## 2015-04-11 NOTE — Consult Note (Signed)
Brief Consult Note: Diagnosis: ANEMIA, HYPOTENTION, ARF,  UTI, ENCEPHALOPATHY.   Patient was seen by consultant.   Recommend further assessment or treatment.   Orders entered.   Comments: PATIENT SEEN, CHART PLUS RECENT HOSPITALIZATION REVIEWED. RECENT ADMISSION HGB AS LOW AS 7.7, TRANSFUSION GIVEN, NOT EVALUATED. CURRENTLY HGB DROPPED AS LOW AS 5.9 IN AM, THEN 2 U PRBC GIVEN. WBC AND PLTS HAVE BEEN UNREMARKABLE. PLTS MINIMALLY DOWN NOW, LIKELY INFECTION AND DILUTION EFFECTS. HGB RISING TO 10.5 INDICATES THE 5.9 RESULT MAY NOT IN TRUTH HAVE BEEN QUITE SO LOW. NO EVIDENCE OF BLEED. NO EVIDENCE HEMOLYSIS WITH LDH NORMAL, COOMBS NEG, HAPTOGLOBIN PENDING. THE RDW PRIOR ADMISSION AND NOW VERY HIGH. DOUBT LOW IRON, STUDIES PENDING, BUT FOLLOWED TRANSFUSION AND WITH INFECTION MAY BE SPURRIOUS. 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 RECENT CT ABDO AND PELVIS NO SPLENOMEGALY OR ADENOPATHY. RECENT CT CHEST SOME PERIPHERAL INFILTRATE, MAY HAVE CLEARED ON RECENT CXR.Marland Kitchen.Marland Kitchen.PLAN F/U OUTSTANDING HAPTOGLOBIN, EPO, B12, ELECTROPHERESIS RESULTS. REVIEW CXR, OR IF NEEDED REPEAT CHEST CT TO COMPARE TO EARLY MARCH. WATCH SERIAL CBC.  Electronic Signatures: Marin RobertsGittin, Robert G (MD)  (Signed 04-Apr-13 22:00)  Authored: Brief Consult Note   Last Updated: 04-Apr-13 22:00 by Marin RobertsGittin, Robert G (MD)

## 2015-04-11 NOTE — Consult Note (Signed)
Impression: 69yo WF w/ h/o COPD and bacterial meningitis admitted with pancreatitis and possible early pneumonia.  Her pancreatitis symptoms are improved.  She is eating better and her abd pain has resolved.  Her CXR and CT later in her hospitalization show a more definite infiltrate, but her admission CXR shows a developing process in the same place.  She has been on azithro and ceftriaxone, but her nasal PCR is positive for Methacillin Resistant Staph aureus.  It is possible that she has CAP, but Methacillin Resistant Staph aureus pneumonia is also possible.  Will change her antibiotics to azithro (to complete 5 days) and linezolid (to complete 7 days). Her skin lesions appear to be more consistent with excoriations.  The lesion on her back is very superficial and the liquification under the duoderm is likely from the occlusive dressing rather than true purulence. Continue contact isolation. 6) She should probably have repeat CXR in 2-3 weeks to document clearance of the infiltrate.  Electronic Signatures: Taariq Leitz, Rosalyn GessMichael E (MD) (Signed on 04-Mar-13 16:09)  Authored   Last Updated: 04-Mar-13 16:09 by Esco Joslyn, Rosalyn GessMichael E (MD)

## 2015-04-11 NOTE — H&P (Signed)
PATIENT NAME:  Sabrina [name], WILLIS MR#:  [id number] DATE OF BIRTH:  1946/09/18  DATE OF ADMISSION:  03/20/2012  PRIMARY CARE PHYSICIAN: Dr. Nilda Simmer    CHIEF COMPLAINT: Referred from nursing home with fever, chills, and hypotension.   HISTORY OF PRESENT ILLNESS: Sabrina [name] is a 69 year old Caucasian female nursing home resident who was discharged from this hospital on March 6th after treatment of acute pancreatitis and acute respiratory failure. The patient tells me that she had been vomiting for the last one week or more. She has vague lower abdominal pain of the periumbilical area and suprapubic area. She has fever and chills. Her condition worsened in the last 24 hours and the nursing staff at the nursing home noted that her blood pressure was low. The patient was brought here to the Emergency Department for evaluation and her clinical picture was consistent with septic shock. The culprit appears to be the urine, however, there was suspicion for pneumonia as well. The patient admits having cough lately with some sputum production, however, she does not elaborate much on her symptoms.   REVIEW OF SYSTEMS: CONSTITUTIONAL: Admits having fever and chills and some fatigue. EYES: No blurring of vision. No double vision. ENT: No hearing impairment. No sore throat. No dysphagia. CARDIOVASCULAR: No chest pain. Admits having shortness of breath. No syncope. RESPIRATORY: The patient reports cough with some sputum production. No chest pain. Mild shortness of breath. GASTROINTESTINAL: As above, she has vague crampy abdominal pain of the periumbilical area and suprapubic area. Reported vomiting. No hematemesis. No coffee-ground emesis. No melena or hematochezia. GENITOURINARY: Denies dysuria or frequency of urination. MUSCULOSKELETAL: No joint pain or swelling. No muscular pain or swelling. INTEGUMENTARY: No skin rash. No ulcers. NEUROLOGIC: No focal weakness. No seizure activity. No headache. PSYCHIATRY: No anxiety or  depression. ENDOCRINE: No polyuria or polydipsia. No heat or cold intolerance.   PAST MEDICAL HISTORY:  1. Recent admission for acute pancreatitis and acute respiratory failure secondary to pneumonia.  2. History of systemic hypertension.  3. History of chronic obstructive pulmonary disease.  4. The patient reports remote history of meningitis and lower extremity skin infection.   SOCIAL HISTORY: She is divorced. Lives at nursing home.   SOCIAL HABITS: Ex chronic smoker. She quit in 1996. No history of alcohol or drug abuse.   FAMILY HISTORY: Her father had hypertension, diabetes, stroke, and coronary disease. Her mother suffered from asthma.   PAST SURGICAL HISTORY: History of cholecystectomy.   ADMISSION MEDICATIONS:  1. Advair 250/50 twice a day. 2. Spiriva 1 inhalation a day.  3. Combivent inhaler.  4. Protonix 40 mg p.o. daily.  5. Metoprolol 25 mg daily.  6. MS Contin 15 mg twice a day.  7. Norco 5/325 mg one q.4 hours p.r.n. for pain. 8. Furosemide 20 mg once a day.  9. K-Dur 20 mEq once a day. 10. Mylanta 30 mL p.r.n.  11. Zoloft 50 mg once a day. 12. Vitamin C 500 mg once a day.   ALLERGIES: Levaquin, doxycycline, and ibuprofen.    PHYSICAL EXAMINATION:   VITAL SIGNS: Her systolic blood pressure was 80, subsequent systolic blood pressure is 94/56 after fluid challenge, respiratory rate 22, pulse 110, temperature 98.4, oxygen saturation 99% on oxygen.   GENERAL APPEARANCE: Elderly lady laying in bed in no acute distress.   HEAD: Mild pallor. No icterus. No cyanosis.   EARS, NOSE, AND THROAT: Hearing was normal. Nasal mucosa, lips, tongue were normal. She is edentulous.   EYES: Normal eyes  and conjunctivae. Pupils about 4 mm, equal and sluggishly reactive to light.   NECK: Supple. Trachea at midline. No thyromegaly. No cervical lymphadenopathy. No masses.   HEART: Normal S1, S2. No S3, S4. No murmur. Distant heart sounds. No carotid bruits.   RESPIRATORY: Normal  breathing pattern without use of accessory muscles. There are fine rales one-third on the right side of the chest. Minimal very few fine crackles at the left base.   ABDOMEN: Soft. There is mild tenderness at the suprapubic area. No rebound. No rigidity. No hernias.   SKIN: No ulcers. No subcutaneous nodules. there are scarring of skin both lower extremities above ankles.   MUSCULOSKELETAL: No joint swelling. No clubbing.   NEUROLOGIC: Cranial nerves II through XII were intact. No focal motor deficit.   PSYCHIATRY: The patient is alert, oriented to place and people. Mood and affect were flat.   LABORATORY, DIAGNOSTIC, AND RADIOLOGICAL DATA: Chest x-ray showed perihilar infiltrates, unsure if this represents pneumonia.   Urinalysis showed turbid urine, more than 600 white blood cells, +3 leukocyte esterase, and +2 blood. CBC showed white count 9000, hemoglobin 8.6, hematocrit 25, platelet count 101. Her hemoglobin on March 6th was 9 and hematocrit was 26. Her platelet count was 96. Serum glucose 103, BUN 92, creatinine 3.2. Her baseline creatinine on March 6th this month was 1.06. Her sodium here today is 144 with a potassium of 5.4. Bicarbonate was 21. GFR estimated less than 15. Total protein 6.8, albumin 3. Normal liver transaminases. Troponin less than 0.02. The patient had urine culture results on March 31st showing more than 100,000 colonies of Pseudomonas aeruginosa. This was sensitive to Ceftazidime, Cefotan, gentamicin, Cipro, and levofloxacin.   ASSESSMENT:  1. Septic shock. The culprit appears to be urinary tract infection, however, there is a question about pneumonia as well.  2. Urinary tract infection with recent culture showing Pseudomonas aeruginosa.  3. Question of pneumonia by chest x-ray. This needs to be further clarified with the radiologist.  4. Acute renal failure likely secondary to sepsis and hypotension. Consider acute renal tubular necrosis.  5. Other medical problems  include history of chronic obstructive pulmonary disease, recent admission with acute pancreatitis, history of cholecystectomy.   PLAN: The patient will be admitted to the Intensive Care Unit. Will continue IV fluid challenge. The patient had blood cultures taken and urine culture. She received in the Emergency Department Rocephin and Zithromax. I will continue Zithromax but at a lower dose to 250 mg daily. I will change Rocephin to ceftazidime 1 mg q.12 hours. This will cover for pneumonia but also will encompass coverage for urinary tract infection with Pseudomonas aeruginosa. Once the kidney function improves, will adjust the dose. I will also add gentamicin 160 mg intravenously daily to have synergistic effect with the ceftazidime to cover for gram-negatives. Again, this dose can be adjusted once the kidney function improves or can be discontinued once we have urine culture results and then use appropriate antibiotic if needed. I will repeat basic metabolic profile in the morning after IV hydration to follow-up on the creatinine and also on the potassium since it is elevated. I will continue her home medications as listed above. However, I will hold the metoprolol and I will also hold the morphine for the time being being hypotensive. She has back pain and asking for pain medication. I will use Norco q.4 hours p.r.n. in this setting. Once her blood pressure improves, we can resume MS Contin. I will avoid using chemical anticoagulation  in this setting with low hemoglobin and low platelet and use TED stockings.   TIME SPENT ON THIS PATIENT: More than one hour.   ____________________________ Carney CornersAmir M. Rudene Rearwish, MD amd:drc D: 03/20/2012 01:50:42 ET T: 03/20/2012 08:02:19 ET JOB#: [id number]  cc: Carney CornersAmir M. Rudene Rearwish, MD, <Dictator> Myrle ShengKristi M. Katrinka BlazingSmith, MD Carney CornersAMIR M Bridgit Eynon MD ELECTRONICALLY SIGNED 03/21/2012 22:25

## 2015-04-11 NOTE — Discharge Summary (Signed)
PATIENT NAME:  Sabrina [name], Sabrina [name] MR#:  [id number] DATE OF BIRTH:  May 27, 1946  DATE OF ADMISSION:  02/11/2012 DATE OF DISCHARGE:  02/16/2012  DISCHARGE DIAGNOSES:  1. Possible acute pancreatitis, resolved.  2. Hypokalemia.  3. Hypomagnesemia.  4. Hyperglycemia. 5. Anemia.  6. Chronic obstructive pulmonary disease.  7. Hypertension.  8. Acute renal failure, possible right base pneumonia/atelectasis.   DISPOSITION: The patient is being discharged to a rehab facility.   DIET: Low salt, low fat, low cholesterol.   ACTIVITY: As tolerated. Continue physical therapy.   DISCHARGE MEDICATIONS:  1. Advair 250/50, 1 puff b.i.d.  2. Spiriva 18 mcg inhaled daily.  3. Combivent 2 puffs every 4 hours p.r.n. wheezing and shortness of breath. 4. Xopenex/ipratropium 1.25/0.5 mg nebulizer every 6 hours p.r.n. shortness of breath.  5. Tylenol 650 mg every 6 hours p.r.n.  6. Zofran ODT 4 mg every 6 hours p.r.n.  7. Prednisone 40 mg for 1 day, then 30 mg for 1 day, then 20 mg for 1 day, then 10 mg for 1 day, then stop. 8. Protonix 40 mg daily.  9. Augmentin 875 mg b.i.d. for 7 days.   LABORATORY, DIAGNOSTIC AND RADIOLOGICAL DATA:  Abdominal three-way: Nonobstructed bowel gas pattern.  Abdominal ultrasound: Prior cholecystectomy. Mild dilatation of the common bile duct. The pancreatic duct is upper limit of normal. The pancreas is not adequately visualized.  CAT scan of the abdomen with oral contrast showed no definite inflammatory changes in the pancreatic body or tail. No pancreatic dilatation or intrahepatic ductal dilatation. The gallbladder is surgically absent. No splenomegaly. No intra-abdominal or pelvic lymphadenopathy. Bilateral pleural effusions, basilar atelectasis versus pneumonia in the right lower lobe. A small amount of ascites. Sigmoid diverticulosis without evidence of diverticulitis. Right inguinal hernia containing only fat.  CBC: White count normal, hemoglobin ranging from 11.6 to 7.7.  Normal platelet count. Lipase level more than 1297 on admission, 64 by the time of discharge.  Hemoglobin A1c 6.5. Creatinine 1.78 to 0.93, potassium 3.2-replaced, low magnesium-replaced.  Normal LFTs. Normal bilirubin.  Normal cardiac enzymes.   HOSPITAL COURSE: The patient is a 69 year old female with past medical history of chronic obstructive pulmonary disease who presented with abdominal pain. She was found to have acute pancreatitis with elevated lipase level. An abdominal ultrasound was done which showed no acute abnormalities. The patient is status post cholecystectomy and does not drink any alcohol. Her fasting lipid profile was normal. Her bilirubin and LFTs were also normal other than very mildly elevated AST. She was initially kept n.p.o. and treated with p.r.n. analgesics and antiemetics. Once her abdominal pain improved and her lipase normalized, she was started on a clear liquid diet and currently she is tolerating a low fat, low cholesterol diet. The patient continued to ask for IV morphine, although her lipase had normalized, complaining of abdominal pain. Therefore, a repeat lipase level was checked which was normal. An abdominal CT scan was also obtained to rule out any intra-abdominal pathology. It was essentially normal other than possible atelectasis versus pneumonia in the right lung base. The clinical suspicion for atelectasis was more since the patient had been in bed for most of the time since hospitalization, and she had normal white count and was afebrile; however, given the patient's age and respiratory status, the patient is being started on antibiotics. She had electrolyte abnormalities including hypokalemia and hypomagnesemia which have been replaced. She also had some hyperglycemia. Her hemoglobin is A1c 6.5. The patient was aggressively hydrated with fluids initially  and developed hemodilutional anemia. Her hemoglobin fell to 7.7. She had to be given 1 unit of blood. She had  mild chronic obstructive pulmonary disease exacerbation during the hospitalization and was treated with nebulizer treatments, Advair, Spiriva, and a short steroid taper.  The patient was hypotensive initially. Currently her blood pressure is well controlled. She had mild renal failure possibly due to dehydration versus ATN from borderline low blood pressure, which has currently resolved. The patient is being discharged in a stable condition.     TIME SPENT: 45 minutes.   ____________________________ Darrick Meigs, MD sp:cbb D: 02/16/2012 15:59:58 ET T: 02/16/2012 16:20:45 ET JOB#: [id number]  cc: Darrick Meigs, MD, <Dictator> Darrick Meigs MD ELECTRONICALLY SIGNED 02/16/2012 19:25

## 2015-04-11 NOTE — Consult Note (Signed)
PATIENT NAME:  Sabrina [name], [name]:  [id number] DATE OF BIRTH:  [birth year]  DATE OF CONSULTATION:  03/21/2012  REFERRING PHYSICIAN:   CONSULTING PHYSICIAN:  Knute Neuobert G. Montrel Donahoe, MD  HISTORY OF PRESENT ILLNESS: Ms. Sabrina [name] is a 69 year old patient who was admitted on 04/03 with septic shock and urinary tract infection, also acute renal failure as well as some exacerbation of underlying chronic obstructive pulmonary disease and was admitted to Intensive Care with fluid and antibiotic support. She was seen by other specialists including infectious disease with Dr. Leavy CellaBlocker. Hematology was consulted for anemia particularly since is appeared on 04/04 the hemoglobin had dropped down to 5.9 grams.    PAST MEDICAL HISTORY:  1. Recent admission for acute pancreatitis and acute respiratory failure with pneumonia. During that hospitalization patient's hemoglobin was as low as 7.7 after hydration and transfusion was given but anemia was otherwise not evaluated. Prior white count and platelets had been unremarkable.  2. Hypertension. 3. Chronic obstructive pulmonary disease. 4. Meningitis in the distant past.  5. Skin infections positive for MRSA.  6. Recent Pseudomonas urinary tract infection.   SOCIAL HISTORY: Patient lives in a nursing home and was a smoker, quit in 1996. No alcohol.   FAMILY HISTORY: Noncontributory.   MEDICATIONS ON ADMISSION:  1. Advair plus Spiriva and Combivent. 2. Protonix 40 daily.  3. Metoprolol 25 daily. 4. MS Contin 15 mg twice a day.  5. Norco every four hours p.r.n.  6. Lasix 20 mg daily.  7. K-Dur 20 daily.  8. Mylanta. 9. Zoloft 50 daily. 10. Vitamin C daily.   ALLERGIES: Levaquin, doxycycline and ibuprofen.   REVIEW OF SYSTEMS: Additional system review was not obtainable as patient who had had waxing and waning mental status was then agitated and sedated.  PHYSICAL EXAMINATION:  GENERAL: Was on pressors. Vital signs were stable.   NECK: There was no lymphadenopathy  in the neck.   LUNGS: Clear.   HEART: Regular.   ABDOMEN: Not tender. There is no obvious palpable abdominal mass. The patient looks critically ill. Other examiners have elicited abdominal tenderness.   SKIN: There were no rashes or edema or cyanosis.   RESPIRATORY: There is scattered rhonchi in the lungs. Patient was not wheezing.   LABORATORY, DIAGNOSTIC, AND RADIOLOGICAL DATA: Bilirubin 0.4, alkaline phosphatase was unremarkable. Albumin was low at 2.0. The patient was acidotic previous day with pH of 7.23. Creatinine 1.92, calcium 7.5. PT/INR 1.2, fibrin degradation products were less than 10, white count 6.3, platelet count 189, hemoglobin 5.9 with an MCV of 101, RDW 26.8. Chest x-ray showed increased interstitial markings. No evidence of definite pneumonia. Patient was transfused after the 5.9 hemoglobin prior to my evaluation. Later the hemoglobin was up to 10.5, after 2 units of blood indicating that the 5.9 was probably spurious, hemoglobin was probably not quite as low as 5.9. There is no evidence of acute bleed. No evidence of hemolysis. LDH normal. Coombs negative. Haptoglobin was done and is pending. Looking at the RDW on prior admission was very high indicates pre-existing dimorphic population but that is consistent with entities such as marrow infiltration or fibrosis or transfusions or reconstituting from deficiencies but this does not look like iron deficiency. The MCV should be much lower with anemia getting to this level. Most likely the patient has a multifactorial anemia of chronic disease plus acute disease plus azotemia. This certainly might be underlying MDS or refractory anemia. Notably also that on a recent CT of the abdomen there was no  splenomegaly, no adenopathy and no mass on a recent CT of the chest.   SUMMARY AND PLAN: Significant multifactorial anemia, probably acute and chronic disease and infection. No evidence of bleeding. No evidence of iron deficiency. Iron studies  should be checked. Haptoglobin is pending. Would document B12 and erythropoietin level electrophoresis results. Would follow serial CBCs watching the other cell lines. Later would review results and if evidence of a hematology disorder, which does not appear likely now but this certainly could emerge as myeloma but the patient may continue to drop hemoglobin in the future and thereby have a significant recurrent anemia, hematology can follow. However, there is currently significant acute illness that is followed by medicine and pulmonary and hemoglobin up to current level there is nothing else acutely from hematology. Plan to follow up the patient on April 8 watching the status and watching the progress.   ____________________________ Knute Neu [name], MD rgg:cms D: 03/24/2012 00:36:00 ET T: 03/25/2012 09:58:36 ET JOB#: [id number]  cc: Knute Neu. [name], MD, <Dictator> Marin Roberts MD ELECTRONICALLY SIGNED 03/29/2012 9:10

## 2015-04-11 NOTE — Op Note (Signed)
PATIENT NAME:  Sabrina [name], Sabrina [name] MR#:  [id number] DATE OF BIRTH:  September 22, 1946  DATE OF PROCEDURE:  03/20/2012  PREOPERATIVE DIAGNOSES:  1. Hypotension requiring pressors.  2. Possible sepsis.  3. Poor venous access.   POSTOPERATIVE DIAGNOSES:  1. Hypotension requiring pressors.  2. Possible sepsis.  3. Poor venous access.   PROCEDURES PERFORMED: 1. Ultrasound guidance for vascular access, right jugular vein.  2. Placement of right jugular triple lumen catheter.   SURGEON: Annice NeedyJason S. Jonica Bickhart, M.D.   ANESTHESIA: Local.  ESTIMATED BLOOD LOSS: Minimal.   INDICATION FOR PROCEDURE: This is a 69 year old white female admitted with hypotension, to the critical care unit. She has possible sepsis and has been started on IV antibiotics. She has required volume resuscitation and now requires pressors intravenously and has poor venous access. We are asked to place a central line.   DESCRIPTION OF PROCEDURE: The patient was laid flat in her critical care bed. Her right neck were sterilely prepped and draped and a sterile surgical field was created. The right jugular vein was visualized with ultrasound and found to widely patent. It was then accessed under direct ultrasound guidance without difficulty with a Seldinger needle. A J-wire was placed. After skin nick and dilatation, the triple lumen catheter was placed over the wire and the wire was removed.     All three ports withdrew blood well and flushed easily with sterile saline. It was secured at 19 cm to the skin with three silk sutures. A stat chest x-ray is pending.  ____________________________ Annice NeedyJason S. Hurshell Dino, MD jsd:slb D: 03/20/2012 16:07:41 ET T: 03/20/2012 16:59:02 ET JOB#: [id number]  cc: Annice NeedyJason S. Yoshiye Kraft, MD, <Dictator> Annice NeedyJASON S Lenita Peregrina MD ELECTRONICALLY SIGNED 03/22/2012 8:12

## 2015-04-11 NOTE — Consult Note (Signed)
PATIENT NAME:  Sabrina [name], Sabrina [name] MR#:  [id number] DATE OF BIRTH:  1946/04/12  DATE OF CONSULTATION:  02/19/2012  REFERRING PHYSICIAN:  Delfino LovettVipul Shah, MD CONSULTING PHYSICIAN:  Rosalyn GessMichael E. Windsor Zirkelbach, MD  REASON FOR CONSULTATION: Pneumonia and positive methicillin-resistant Staphylococcus aureus screening test.   HISTORY OF PRESENT ILLNESS: The patient is a 69 year old white female with a past history significant for bacterial meningitis and chronic obstructive pulmonary disease who was admitted on 02/11/2012 with nausea, vomiting, abdominal pain, and malaise. She recently had abdominal pain that was 10/10, in the mid abdomen. In the History and Physical, it indicates she was not having any fevers and chills, but when I asked her she said she was having some fevers and chills. She has been having some cough as well, but no sputum production. She had not had any diarrhea or blood in the stool. She was admitted to the hospital and found to have a significantly elevated lipase of up to 3335. It has come down to normal subsequently. Her white count on admission was 7.6 and has remained normal. She had been clinically improving and was to the point of discharge on 02/16/2012 when she was noted to not be feeling very well and continuing to complain of malaise and cough. A repeat chest x-ray was obtained which showed an infiltrate in the right lower lobe. Review of her admission chest x-ray shows a right middle lobe density. CT scan of the chest showed a peripheral infiltrate in the left upper lobe most likely consistent with pneumonia. She has been started on azithromycin and ceftriaxone, on 02/17/2012. She states that her breathing is somewhat better, but not at her baseline.   ALLERGIES: Doxycycline, ibuprofen, and Levaquin.   PAST MEDICAL HISTORY:  1. Bacterial meningitis. There is no documentation that this was from MRSA, but she also has a history of MRSA infection. 2. Chronic obstructive pulmonary disease.   3. Hypertension.   SOCIAL HISTORY: She is a prior smoker. She does not drink. No injecting drug use history.   FAMILY HISTORY: Positive for diabetes, hypertension, stroke, heart attack, and asthma.   CURRENT ANTIBIOTICS: Azithromycin and ceftriaxone.   REVIEW OF SYSTEMS: GENERAL: Positive fevers, chills, and malaise. HEENT: No headache. No sinus congestion. No sore throat. NECK: No stiffness. No swollen glands. RESPIRATORY: Positive cough. No significant sputum production. No wheezing. Some mild shortness of breath. CARDIAC: She has had occasional chest pain on the left. No peripheral edema. GI: Positive nausea and vomiting on admission. The vomiting has resolved. She still has some nausea. She had decreased p.o. intake that has been improving. Positive abdominal pain, which has now resolved. No change in her bowels. GENITOURINARY: No change in her urine. MUSCULOSKELETAL: No focal complaints. No swelling of her joints. NEUROLOGIC: No focal weakness. PSYCHIATRIC: No complaints. SKIN: She has had multiple lesions over her arms that are pruritic. She has scratched at them and they have formed scabs. She does not recall any frank pustular lesions. Of note, she had a DuoDERM over her back that nursing had noted some purulent material underneath the dressing.   PHYSICAL EXAMINATION:   VITAL SIGNS: T-max 100.9, T-current 98.5, pulse 95, blood pressure 122/77, and saturation 99% on 2 liters.   GENERAL: A 69 year old white female in no acute distress.   HEENT: Normocephalic, atraumatic. Pupils equal and reactive to light. Extraocular motion intact. Sclerae, conjunctivae, and lids are without evidence for emboli or petechiae. Oropharynx shows no erythema or exudate. Teeth and gums are in fair condition.  NECK: Supple. Full range of motion. Midline trachea. No lymphadenopathy. No thyromegaly.   LUNGS: Clear to auscultation bilaterally with good air movement. No focal consolidation. She is able to speak  in full sentences.   HEART: Regular rate and rhythm without murmur, rub, or gallop.   ABDOMEN: Soft, nontender, and nondistended. No hepatosplenomegaly. No hernia is noted.   EXTREMITIES: No cyanosis, clubbing, or edema. Both upper extremities have some muscle mass loss in the triceps with what appears to be surgical scars. She had no edema present   SKIN: Multiple areas over the arms with eschars present. There is no significant surrounding erythema. There was no purulent drainage. These were consistent with excoriations. There was a larger lesion over the back that measured approximately 2 cm in diameter that was a superficial  ulcerative. There was a DuoDERM over it and there was some drainage collection under the DuoDERM patch. There was no stigmata of endocarditis, specifically no Janeway lesions or Osler nodes.   NEUROLOGIC: The patient was awake and interactive, moving all four extremities.   PSYCHIATRIC: Mood and affect appeared normal.   LABS/STUDIES: She had a BUN of 19 and creatinine 1.0 yesterday with a bicarbonate of 18 and anion gap of 17. LFTs were unremarkable. Her troponins have been mildly elevated over the last several days. White count was 8.3 yesterday with a hemoglobin of 9.1 and platelet count of 239 and ANC of 6.5. On admission her white count was 7.6. A MRSA screen was positive, on 02/19/2012.   Urinalysis from admission was negative.   Lipase on admission was over 3000.  A 3-way of the abdomen demonstrated a nonobstructive bowel gas pattern. There was a subtle density at the periphery of the right mid lung that could represent an area of atelectasis or scarring, although it had a slightly nodular morphology.  Ultrasound from admission showed partial visualization of the pancreas, but the visualized portion was relatively echogenic suggesting pancreatitis. The pancreatic duct was at the upper limit of normal to mildly dilated.   A CT scan of the abdomen and pelvis  without contrast demonstrated no inflammation of the pancreas. There was no ductal dilatation noted. There were bilateral pleural effusions and basilar atelectasis present in the right lower lobe.  A chest x-ray from 02/17/2012 showed right middle lobe infiltrate consistent with pneumonia.   A CT scan of the chest with contrast showed peripheral infiltrate in the left upper lobe, most likely pneumonia. There was no evidence for pulmonary embolus. There were small bilateral pleural effusions present.   IMPRESSION: A 69 year old white female with a history of chronic obstructive pulmonary disease and bacterial meningitis admitted with pancreatitis and possible early pneumonia.   RECOMMENDATIONS:  1. Her pancreatitis symptoms are improved. She is eating better and her abdominal pain is resolved.  2. Her chest x-ray and CT later in her hospitalization show a more definite infiltrate, but her admission chest x-ray shows a developing process on the same side. She has been on azithromycin and ceftriaxone, but her nasal PCR is positive for MRSA. It is possible that she had been developing community-acquired pneumonia, but MRSA pneumonia is also possible.  3. We will change her antibiotics to azithromycin to complete five days and linezolid to complete seven days.  4. Her skin lesions appear to be more consistent with excoriations. The lesion on her back is very superficial and liquidation under the DuoDERM is likely from the occlusive dressing rather than true purulence.  5. We will continue contact  isolation for the positive MRSA PCR.  6. She should probably have a repeat chest x-ray in 2 to 3 weeks to demonstrate clearance of the infiltrate.   This is a moderately complex infectious disease case. Thank you very much for involving me in Sabrina [name]'s care.  ____________________________ Rosalyn Gess. Khylan Sawyer, MD meb:slb D: 02/19/2012 16:21:53 ET T: 02/19/2012 17:12:48 ET JOB#: [id number]  cc: Rosalyn Gess.  Jemimah Cressy, MD, <Dictator> Gal Feldhaus E Zoee Heeney MD ELECTRONICALLY SIGNED 02/20/2012 15:36

## 2015-04-11 NOTE — Consult Note (Signed)
Referring Physician:  Ellin Saba :   Primary Care Physician:  Wardell Honour : [facility], [address] [place], [place], [place] [address], [phone]  Reason for Consult:  Admit Date: 20-Mar-2012   Chief Complaint: altered mental status   Reason for Consult: altered mental status   History of Present Illness:  History of Present Illness:   Ms. Sabrina [name] is seen in consultation at the request of Dr. Edwina Barth for evaluation of altered mental status. Meneely is a 69 year-old female with a past emdical history significant for multiple sclerosis (per family, was seen by [facility] many years ago, not on any immunosuppresive medications), hypertension, recent admission for pancreatitis and pneumonia, and COPD who was hospitalized on 03/20/2012 for altered mental status. Per patient's family, since October 2012, the patient has had two falls and has had a slow, gradual decline with regards to her strength and mental status since. However, she is still usually very cognizant of her surroundings and had been living at home independently up until her most recent hospitalization about 6 weeks ago. About 1.5 weeks ago, however, the patient became floridly confused, as she was confused about her surroundings and was having hallucinations. Her mental status seemed to wax and wane; at times, she could carry on a normal conversation, and at other times she would be very agitated. She was hospitalized at [facility] on 03/20/2012, with some initial concern for possible sepsis. She was on pressors for hypotension for several days. In addition, she was noted to be in acute renal failure on admission. UA on admission was noted to have a significant amount of pyuria and 3+ LE, and urine culture grew out >100,000 yeast. She was initially treated with meropenem, vancomycin, and fluconazole, but her antibiotics have been weaned to just meropenem per infectious disease. She did receive a blood transfusion  earlier during this admission due to a severe drop in her hemoglobin. Patient's family notes that her mental status continues to decline in the hospital, as she contineus to wax and wane. She has been on a precedex gtt to help treat agitation. Per patient's nurse, the patient received a dose of haldol to complete a TTE yesterday, but otherwise has not gotten any other sedating medications.  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  multiple sclerosis:   Fx humerus:   Meningitis:   Asthma:   Emphysema:   Lap Chole:   hysterectomy:   Past Medical/ Surgical Hx:   Past Medical History Recent admission for acute pancreatitis and acute respiratory failure secondary to pneumonia.  History of systemic hypertension.  History of chronic obstructive pulmonary disease.  Remote history of meningitis and lower extremity skin infection. History of multiple sclerosis (was seen by [facility], per family)   Home Medications: Medication Instructions Last Modified Date/Time  Advair Diskus 250 mcg-50 mcg inhalation powder 1 puff(s) inhaled 2 times a day 06-Mar-13 15:17  Spiriva 18 mcg inhalation capsule 1 each inhaled once a day 06-Mar-13 15:17  Combivent 18 mcg-103 mcg-/inh inhalation aerosol  2 puff(s) inhaled every 4 hours, As Needed- for Wheezing, shortness of breath 06-Mar-13 15:17  Xoponex/ipratropium 1.25/0.5 nebs   every 6 hours, As Needed- for Shortness of Breath  06-Mar-13 15:12  Tylenol Arthritis Caplet 650 mg oral tablet, extended release 1 tab(s) orally every 6 hours, As Needed 06-Mar-13 15:17  Zofran ODT 4 mg oral  tablet, disintegrating 1 tab(s) orally every 6 hours, As Needed- for Nausea, Vomiting  06-Mar-13 15:17  predniSONE 10 mg oral tablet 55m for 1 day, then 360mfor 1 day, then 2070mor 1 day, then 28m78mr 1 day, then stop 06-Mar-13 15:17  Protonix 40 mg oral delayed release tablet 1 tab(s) orally once a day 06-Mar-13 15:17  Zithromax 500 mg oral tablet 1 tab(s) orally once a day for 3 days 06-Mar-13 15:17  Lasix 20 mg oral tablet 1 tab(s) orally once a day 06-Mar-13 15:17  Zyvox 600 mg oral tablet 1 tab(s) orally every 12 hours for 5 days 06-Mar-13 15:17  metoprolol tartrate 25 mg oral tablet 1 tab(s) orally once a day 06-Mar-13 15:17   [facility] Current Meds:  NORepinephrine Drip,  ( Levophed ) 4 mg D5W 250ml56memix)  Concentration: (16 mcg/ml) - Final Volume: 250 mL, Dose Rate: 3 mcg/min at initial rate of 11.3 ml/hr- Maintain Mean Arterial Pressure GREATER than 65, Admin Instructions: **CENTRAL LINE ONLY**  Dexmedetomidine Drip,  ( Precedex Drip ) 400 mcg /NS 100 ml in Sodium Chloride 0.9% 96 ml  Concentration: (4 mcg/ml) - Final Volume: 100 mL, Dose Rate: 1 mcg/kg/hr at initial rate of 16.9 ml/hr  -Indication:sedation  Sodium Chloride 0.9%, 1000 ml     Multivitamin Conc 12 injection 10 ml     Magnesium Sulfate injection 1 gram     Thiamine injection 100 m740   Folic Acid injection 1 mg at 20 ml/hr, Instructions: **additives to one bag daily**If IV rate chosen is greater than 40 ml/hr, then there needs to be a second fluid ordered to infuse rest of the 24 hour period.  Dextrose 5%, 1000 ml     Potassium Chloride injection 30 mEq at 50 ml/hr  Sodium Chloride 0.9% injection, 2 ml, IV push, q6h  Acetaminophen * tablet, ( Tylenol (325 mg) tablet)  650 mg Oral q4h PRN for pain or temp. greater than 100.4  - Indication: Pain/Fever  Ondansetron injection, ( Zofran injection )  4 mg, IV push, q4h PRN for Nausea/Vomiting  Indication: Nausea/ Vomiting  Fluticasone/Salmeterol 250/50 inhaler,  (  Advair Diskus 250/50 inhaler )  1 puff(s) Inhalation bid  Instructions:  RINSE MOUTH AFTER USE  Tiotropium Handihaler 5'S,  ( SPiriVA Handihaler )  1 capsule(s) Inhalation daily  Capsules are for use in inhaler - not for oral use.  Acetaminophen-HYDROcodone 325/10 mg tablet,  ( Norco 10/325 mg)  1 to 2 tablet(s) Oral q4h PRN for pain  - Indication: Pain  Instructions:  MAX 24hr dose Acetaminophen = 4000 mg, Each tab contains 325mg.14mPRAZolam tablet, 0.25 mg Oral q8h PRN for anxiety  - Indication: Anxiety/ Depression/ Panic  Instructions:  for anxiety  Nystatin Cream, ( Mycostatin cream )  Apply to affected area q12h  -Indication:Fungal Infection  Line Flush - Normal Saline, 5 ml, IV push, daily  Indication: Line patency, Flush each UNUSED port with 5 ml saline, 5 ml to each unused lumen daily  Line Flush - Normal Saline, 5 to 10 ml, IV push, ud PRN for line patency, Flush each lumen with 5 ml  before and 10 ml after each med admin, TPN, blood draw or blood administration.  Line Flush Heparin 10 units/ml injection, 5 ml, IV push, ud PRN for line patency  Indication: Line Patency, Monitor Anticoags per hospital protocol, Flush lumen with 50 units after med admin, TPN, blood draws or blood administration.  Line Flush Heparin 10 units/ml injection, 5 ml, IV push, daily  Indication: Line Patency, Monitor Anticoags per hospital protocol, Flush each UNUSED port with 50 units Heparin every 24 hours., Flush each unused lumen with 50 units every 24 hours.  Influenza Virus Trivalent Vaccine injection, 0.5 ml, Intramuscular, atdischarge  Indication: Provide Active Immunity to Influenza Strains contained in Vaccine, GIVE ON DISCHARGE DAY.  Pneumococcal 23-valent Vaccine, 0.5 ml, Intramuscular, atdischarge  Indication: Pneumococcal Immunization, 0.59m IM once (Stored in Pyxis Refrigerator)  Pantoprazole injection, ( Protonix injection )  40 mg, IV push, q6am  Indication: Erosive Esophagitis/ GERD, 1.  Reconstitute Protonix with 165mof Normal Saline; concentration= 59m48mL. Once reconstituted; Protonix must be administered within 30 minutes.  2. I.V. line must be flushed before and after administration. Administer 67m67m0mg56mV. push over 2-3 minutes.  Haloperidol injection,  ( Haldol injection )  1 to 2 mg, IV push, q4h PRN for agitation  Indication: Psychosis/ Delirium, agitation  Meropenem Injection, 1 gram in Sodium Chloride 0.9% 100 ml, IV Piggyback, q12h, Infuse over 30 minute(s)  Indication: Infection  Line Flush - Normal Saline, 5 ml, IV push, daily  Indication: Line patency, Flush each UNUSED port with 5 ml saline, 5 ml to each unused lumen daily  Line Flush - Normal Saline, 5 to 10 ml, IV push, ud PRN for line patency, Flush each lumen with 5 ml before and 10 ml after each med admin, TPN, blood draw or blood administration.  Line Flush Heparin 10 units/ml injection, 5 ml, IV push, ud PRN for line patency  Indication: Line Patency, Monitor Anticoags per hospital protocol, Flush lumen with 50 units after med admin, TPN, blood draws or blood administration.  Line Flush Heparin 10 units/ml injection, 5 ml, IV push, daily  Indication: Line Patency, Monitor Anticoags per hospital protocol, Flush each UNUSED port with 50 units Heparin every 24 hours., Flush each unused lumen with 50 units every 24 hours.  Allergies:  Ibuprofen: GI Distress  Doxycycline: Unknown  Levaquin: Unknown  Social/Family History:  Social History: Previously lived independently, but has been in a nursing home recently following her last hospitalization. Has previous heavy smoking history, but quit about 7-8 years ago. Family denies alcohol or illicit drug use.   Family History: Notable for stroke, diabetes, hypertension   Vital Signs: **Vital Signs.:   06-Apr-13 11:00   Vital Signs Type Routine   Temperature Temperature (F) 97.5   Celsius 36.3   Temperature Source oral   Pulse Pulse 86   Respirations  Respirations 26   Pulse Ox % Pulse Ox % 100   Oxygen Delivery 2L   Pulse Ox Heart Rate 86   Physical Exam:  General: Chronicaly ill-appearing   HEENT: Anicteric sclera   Neck: No bruits   Chest: Coarse breath sounds throughout   Cardiac: RRR   Extremities: No edema   Neurologic Exam:  Mental Status: Awakens to voice, takes a lot of encouragement to follow commands, though does eventually follow commands x 4. Oriented to self and 2013. Speech in very dysarthric, difficult to comprehend patient's speech.   Cranial Nerves: PERRL. EOMI. Face grossly symmetric. Tongue protrudes midline.   Motor  Exam: Limited by patient's lack of cooperation with exam. Grossly appears to display 5/5 strength in ehr upper extremities and 4/5 strength in her bilateral lower extremities.   Deep Tendon Reflexes: Trace throughout. Toes downgoing bilaterally.   Sensory Exam: Grossly intact to light touch per patient   Coordination: Unable to assess due to patient's mental status   Cerebellar Exam: Unable to assess due to patient's mental status   Gait: Unable to assess due to patient's mental status   Lab Results: Thyroid:  04-Apr-13 10:05    Thyroid Stimulating Hormone 3.43  Hepatic:  04-Apr-13 08:00    Bilirubin, Total 0.4   Bilirubin, Direct 0.1   Alkaline Phosphatase 62   SGPT (ALT) 14   SGOT (AST) 18   Total Protein, Serum   4.7   Albumin, Serum   2.0  Routine BB:  04-Apr-13 06:01    Antibody Screen NEGATIVE  05-Apr-13 03:32    Direct Coombs, Polyspecific Negative  Routine Micro:  03-Apr-13 00:00    Organism Name YEAST  Routine Chem:  04-Apr-13 08:00    LDH, Serum 237    19:34    Ferritin ([facility])   650  05-Apr-13 03:32    Iron Binding Capacity (TIBC)   103   Unbound Iron Binding Capacity 3   Iron, Serum 100   Iron Saturation 97  06-Apr-13 01:31    Glucose, Serum   160   BUN   37   Creatinine (comp)   1.38   Sodium, Serum   150   Potassium, Serum   3.2   Chloride, Serum   115    CO2, Serum 26   Calcium (Total), Serum   7.1   Anion Gap 9   Osmolality (calc) 310   eGFR (African American)   50   eGFR (Non-African American)   41  Cardiac:  02-Apr-13 23:18    Troponin I < 0.02  Routine UA:  03-Apr-13 00:00    Color (UA) Amber   Clarity (UA) Turbid   Glucose (UA) Negative   Bilirubin (UA) Negative   Ketones (UA) Trace   Specific Gravity (UA) 1.018   Blood (UA) 2+   pH (UA) 5.0   Protein (UA) 30 mg/dL   Nitrite (UA) Negative   Leukocyte Esterase (UA) 3+   RBC (UA) 29 /HPF   WBC (UA) 654 /HPF   Bacteria (UA) 2+   Epithelial Cells (UA) <1 /HPF   WBC Clump (UA) PRESENT   Budding Yeast (UA) PRESENT   Hyaline Cast (UA) 13 /LPF   Amorphous Crystal (UA) PRESENT  Routine Coag:  04-Apr-13 06:01    Prothrombin   16.0   INR 1.2   Fibrinogen 402    08:00    Fibrin Degradation Products < 10  Routine Hem:  04-Apr-13 19:34    Retic Count   3.3   Absolute Retic Count   0.102  06-Apr-13 01:31    WBC (CBC) 6.8   RBC (CBC)   3.18   Hemoglobin (CBC)   10.7   Hematocrit (CBC)   31.3   Platelet Count (CBC)   120   MCV 98   MCH 33.7   MCHC 34.3   RDW   17.6   Neutrophil % 73.5   Lymphocyte % 13.8   Monocyte % 8.0   Eosinophil % 4.5   Basophil % 0.2   Neutrophil # 5.0   Lymphocyte #   0.9   Monocyte # 0.5   Eosinophil # 0.3  Basophil # 0.0   Radiology Results: CT:    06-Apr-13 09:34, CT Head Without Contrast   CT Head Without Contrast    REASON FOR EXAM:    AMS  COMMENTS:       PROCEDURE: CT  - CT HEAD WITHOUT CONTRAST  - Mar 23 2012  9:34AM     RESULT: Comparison:  None    Technique: Multiple axial images from the foramen magnum to the vertex   were obtained without IV contrast.    Findings:    The images are slightly degraded by patient motion artifact. There is no   evidence for mass effect, midline shift, or extra-axial fluid   collections. There is no evidence for space-occupying lesion,   intracranial hemorrhage, or cortical-based  area of infarction. Mild     periventricular and subcortical hypoattenuation is likely sequela of   chronic microangiopathy.    The osseous structures are unremarkable.    IMPRESSION:    No acute intracranial process.          Verified By: Gregor Hams, M.D., MD   Impression/Recommendations:  Recommendations:   69 year-old female with a past emdical history significant for multiple sclerosis (per family, was seen by [facility] many years ago, not on any immunosuppresive medications), hypertension, recent admission for pancreatitis and pneumonia, and COPD who was hospitalized on 03/20/2012 for altered mental status.  Per patient's family, patient has had a slow, gradual decline with her strength and cognition for the past few months, but this became acutely much worse about 1.5 weeks ago. There is a question of whether patient's underlying multiple sclerosis could be contributing to her mental status. It's certainly possible that if the patient does indeed have multiple sclerosis, her underlying brain disease may make her more susceptible to toxic/metabolic/infectious causes for encephalopathy, but we would not expect that multiple sclerosis would cause her mental status to worsen so dramatically in such a short period of time. Her waxing/waning mental status also tends to favor more of a delirium picture.was noted to have significant pyuria with 3+ LE on UA on admission. Urine culture grew out >100,000 yeast. Infectious disease has been following along and felt the yeast was unlikely to be causing an actual infection. Blood culutes have been negative. It may be worth checking a repeat UA to see if the yeast in the urine has resolved; if not, one might consider treating her empirically for a yeast UTI to seeif her mental status improves.patient was admitted, she was in renal failure. She is on morphine sulfate Contin, norco, and xanax at home, so it's possible that these medications are taking  longerto clear from her system due to her renal failure and may be contributing to her encephalopathy. Renal failure appears to be improving.correcting some of patient's electrolyte abnormalities (currently hypernatremic with sodium 150, hypocalcemic with calcium in the 7s)patient would likely benefit from having a repeat MRI brain with and without contrast done to evaluate whether she has worsening multiple sclerosis disease in her brain. Unfortunately, she is not yet stable enough to toelrate this study. Will attempt to obtain records from [facility] to see what prior work-up was done.try to wean Precedex as much as patient will tolerate, as this will also cloud her neurological exam. Please avoid giving any other sedating medications unelss absolutely necessary. you for this interesting consult. We will continue to follow along during the patient's hospital course.  Electronic Signatures: Rennis Chris (MD)  (Signed 06-Apr-13 13:45)  Authored: REFERRING PHYSICIAN, Primary Care Physician, Consult, History of Present Illness, Review of Systems, PAST MEDICAL/SURGICAL HISTORY, HOME MEDICATIONS, Current Medications, ALLERGIES, Social/Family History, NURSING VITAL SIGNS, Physical Exam-, LAB RESULTS, RADIOLOGY RESULTS, Recommendations   Last Updated: 06-Apr-13 13:45 by Rennis Chris (MD)

## 2015-04-11 NOTE — Consult Note (Signed)
PATIENT NAME:  Sabrina [name], Sabrina [name] MR#:  [id number] DATE OF BIRTH:  [birth year]  DATE OF CONSULTATION:  03/20/2012  REFERRING PHYSICIAN:   CONSULTING PHYSICIAN:  Annice NeedyJason S. Tenae Graziosi, MD  REASON FOR CONSULTATION:  Central line placement.  HISTORY OF PRESENT ILLNESS: This is a 69 year old white female admitted this morning for altered mental status, fever, chills, and hypotension. She is a nursing home resident who has had multiple issues including pancreatitis, respiratory failure, and dehydration. She is admitted for possible sepsis at this point and is remaining hypotensive and now requires pressors. She also has a history of chronic obstructive pulmonary disease and bacterial meningitis. Currently she is quite confused and somewhat combative and has very poor peripheral venous access.  PAST MEDICAL HISTORY: 1. Acute pancreatitis.  2. Acute respiratory failure secondary to pneumonia.  3. Hypertension.  4. Chronic obstructive pulmonary disease.  5. History of bacterial meningitis. 6. Lower extremity skin infections.   SOCIAL HISTORY: Divorced, lives in a nursing home, former smoker.   FAMILY HISTORY: Father had hypertension, diabetes, and stroke. Mother had asthma.   PAST SURGICAL HISTORY: Cholecystectomy.   MEDICATIONS: Per the history and physical: 1. Advair 250/50 b.i.d.  2. Spiriva one inhalation daily.  3. Combivent inhaler daily.  4. Protonix 40 mg daily.  5. Metoprolol 25 mg daily.  6. MS Contin 15 mg b.i.d.  7. Norco as needed for pain.  8. Lasix 20 mg daily.  9. K-Dur 20 milliequivalents daily.  10. Mylanta 30 mL p.r.n.  11. Zoloft 50 mg daily.  12. Vitamin [name] 500 mg daily.   ALLERGIES: Levaquin, doxycycline, and ibuprofen.   REVIEW OF SYSTEMS: The patient cannot currently provide with altered mental status.   PHYSICAL EXAMINATION:  VITAL SIGNS: She has been afebrile since admission to the hospital. Last temperature was 98.7, pulse 124, blood pressure 77/43, saturations are 90% on  4 liters nasal cannula.   GENERAL: She appears much older than her stated age, somewhat combative and confused in the Critical Care Unit.   HEAD: Normocephalic and atraumatic.   EYES: Sclerae anicteric. Corrective lenses in place.   EARS: Normal external appearance. Hearing difficult to assess.   NECK: Supple without adenopathy or jugular venous distention.   HEART: Tachycardic and regular.   LUNGS: Wheezes and rhonchi bilaterally.   ABDOMEN: Soft, nondistended, nontender.   EXTREMITIES: Mild lower extremity edema. Feet are cool. Pedal pulses are not easily palpable. Capillary refill is present.   NEUROLOGIC: Moving all four extremities spontaneously. Follows some commands.   PSYCH: Confused and not oriented.   SKIN: Warm and dry.   LABORATORY, DIAGNOSTIC, AND RADIOLOGICAL DATA: Sodium 141, potassium 4.7, chloride 111, CO2 17, BUN 82, creatinine 2.85, glucose 104. White blood cell count 9.3, hemoglobin 8.6, platelet count 253,000.   ASSESSMENT AND PLAN: This is a 69 year old white female with poor venous access and hypotension with possible sepsis. She requires pressors and IV antibiotics and needs a central line. This will be placed at the bedside. This is a level-3 consultation.    ____________________________ Annice NeedyJason S. Marelly Wehrman, MD jsd:bjt D: 03/20/2012 16:19:16 ET T: 03/20/2012 17:36:39 ET JOB#: [id number]  cc: Annice NeedyJason S. Tahesha Skeet, MD, <Dictator> Annice NeedyJASON S Lexis Potenza MD ELECTRONICALLY SIGNED 03/22/2012 8:13

## 2015-04-11 NOTE — Consult Note (Signed)
Impression: 69yo WF w/ h/o COPD, bacterial meningitis, recent admission for pancreatitis and Methacillin Resistant Staph aureus skin lesions, recent outpt Pseudomonas UTI admitted with possible sepsis with pyuria suggestive of UTI and new onset renal insufficiency.  Her blood cultures are currenty pending.  She could be septic, but she could also be hypovolemic.  She has received bolus IVF and pressors.  Currently her sBP is 100 off pressors.  She is mentating fairly well. Would continue hydration. She received iv gentamicin as an outpt.  Despite this, she developed chills, nausea and vomiting.  Her renal insufficiency is likely due to hypovolemia, but the aminoglycoside is also nephrotoxic. She does not clinically appear to have pneumonia and her CXR is negative.  This may be due to her hypovolemia, but her sats are good.   Her skin lesions appear to have healed quite well.  She has some areas of stage 2 pressure ulceration.  No signs of infection, but her last hospitalization, cultures grew Methacillin Resistant Staph aureus.  She is not currently being treated for Methacillin Resistant Staph aureus. Will change her antibiotics to meropenem and vanco.  Will await urine and blood cultures. Would follow creatinine, WBC. Continue contact isolation.   Electronic Signatures: Edu On, Rosalyn GessMichael E (MD)  (Signed on 03-Apr-13 14:54)  Authored  Last Updated: 03-Apr-13 14:54 by Mukund Weinreb, Rosalyn GessMichael E (MD)

## 2015-04-11 NOTE — Discharge Summary (Signed)
PATIENT NAME:  Sabrina [name], Sabrina [name] MR#:  [id number] DATE OF BIRTH:  1946/07/31  DATE OF ADMISSION:  03/20/2012 DATE OF DISCHARGE:  03/30/2012  ADMITTING PHYSICIAN: Marlaine HindAmir Darwish, MD   DISCHARGING PHYSICIAN: Enid Baasadhika Malajah Oceguera, MD    PRIMARY CARE PHYSICIAN: Nilda SimmerKristi Smith, MD  CONSULTATIONS IN THE HOSPITAL:  1. Infectious Disease consultation by Dr. Orson AloeMichael Blocker.  2. Vascular Surgery consultation by Dr. Wyn Quakerew for line placement.  3. Pulmonary Critical Care consultation by Dr. Belia HemanKasa.  4. GI consultation by Dr. Mechele CollinElliott.  5. Hematology/Oncology consultation by Dr. Lorre NickGittin.  6. Palliative Care consultation by Dr. Harvie JuniorPhifer.  7. Surgical consultation by Dr. Egbert GaribaldiBird for line placement.  8. Neurology consultation by Dr. Regino SchultzeWang.  9. Nephrology consultation by Dr. Mosetta PigeonHarmeet Singh.   DISCHARGE DIAGNOSES:  1. Acute hypoxic respiratory failure.  2. Acute on chronic congestive heart failure with diastolic dysfunction.  3. Chronic obstructive pulmonary disease exacerbation, vent dependent,  and terminally extubated on 03/29/2012.  4. Sinus tachycardia.  5. Septic shock.  6. Pseudomonas urinary tract infection.  7. Acute renal failure secondary to acute tubular necrosis from sepsis, hypotension.  8. Hypokalemia.  9. Metabolic encephalopathy from renal failure, sepsis, hypoxemia.  10. Chronic anxiety.  11. Emphysema  12. Anemia.  DISCHARGE MEDICATIONS:  1. Roxanol 20 mg/ mL, 0.5 to 1 mL/sublingual every 1 to 2 hours p.r.n.  2. Ativan 0.5 to 1 mg p.o. sublingual every 2 to 4 hours p.r.n.  3. ABHR suppository, 1 per rectum every 4 to 6 hours p.r.n.  4. Ranitidine 150 mg p.o. every 12 hours.   DISCHARGE HOME OXYGEN: As needed at [facility].   DISCHARGE DIET: As tolerated.   DISCHARGE ACTIVITY: As tolerated.    FOLLOW-UP INSTRUCTIONS: The patient being transferred [facility].   LABORATORY, DIAGNOSTIC AND RADIOLOGICAL DATA:  At the time of discharge WBC 11.3, hemoglobin 9.3, hematocrit 26.9, platelet  count 158.  Sodium 137, potassium 4.6, chloride 103, bicarbonate 24, BUN 42, creatinine 2.4, glucose of 155 and calcium of 7.8. Magnesium is 2.1.  Chest x-ray on 03/26/2012 showed interstitial prominence representing pulmonary vascular congestion and blunting of left costophrenic angle, scarring versus small pleural effusion. No other regions of consolidation are appreciated.    HOSPITAL COURSE: For a detailed course, please also look at the History and Physical dictated by Dr. Rudene Rearwish on 03/20/2012. For a dull Discharge Summary, please look at Interim Discharge Summary dictated by Dr. Allena KatzPatel on 03/29/2012. In brief, Sabrina [name] is a 69 year old Caucasian female with multiple medical problems as described above, who was admitted for acute hypoxic respiratory failure. It was suspected secondary to congestive heart failure exacerbation and chronic obstructive pulmonary disease flare. She was diuresed with Lasix and was placed on Solu-Medrol. However, her respiratory status worsened, and she was intubated on 03/25/2012. Echocardiogram showed 65% ejection fraction with diastolic dysfunction. The patient also was septic from possible Pseudomonas urinary tract infection and was seen by an Infectious Disease specialist. She was hypotensive, requiring IV pressors, and she was on meropenem. Also, she went into acute renal failure with poor perfusion worsened with worsening creatinine with possible need for hemodialysis in the near future. She was also delirious with altered mental status, not waking up, likely from her underlying sepsis, hypoxemia. She also had history of multiple sclerosis. However, with no improvement while on the ventilator for four days, the family, after a Palliative Care meeting, have decided to terminally extubate her, which happened on 03/29/2012. The patient is lethargic currently, apneic, with some  gasping breaths requiring morphine around-the-clock. The patient to will be transferred to [facility] for further comfort. This was discussed with the patient's family at bedside.   CODE STATUS: DO NOT RESUSCITATE.   TIME SPENT ON DISCHARGE:  45 minutes.   ____________________________ Enid Baas, MD rk:cbb D: 04/01/2012 15:12:23 ET T: 04/02/2012 13:08:28 ET JOB#: [id number]  cc: Enid Baas, MD, <Dictator> Myrle Sheng. Katrinka Blazing, MD Enid Baas MD ELECTRONICALLY SIGNED 04/02/2012 15:06

## 2015-04-11 NOTE — Consult Note (Signed)
    Comments   Called by RN that family ready for extubation. Dr Phifer and I confirmed with son Avon Gully(HCPOA) that he wants comfort care and terminal extubation. Orders written. Pt noted to have spontaneous respirations on vent.  20 minutes  Electronic Signatures for Addendum Section:  Phifer, Harriett SineNancy (MD) (Signed Addendum 12-Apr-13 23:15)  Elouise MunroeJosh Borders, NP, and spoke with pt's son. Family requests extubation and comfort care. Orders entered. Pt extubated. Appears comfortable. Family at bedside.   Electronic Signatures: Borders, Daryl EasternJoshua R (NP)  (Signed 12-Apr-13 14:22)  Authored: Palliative Care   Last Updated: 12-Apr-13 23:15 by Phifer, Harriett SineNancy (MD)

## 2015-04-11 NOTE — H&P (Signed)
PATIENT NAME:  Sabrina [name], Sabrina [name] MR#:  [id number] DATE OF BIRTH:  1946/03/07  DATE OF ADMISSION:  02/11/2012  PRIMARY CARE PHYSICIAN: Dr. Nilda SimmerKristi Smith  REFERRING PHYSICIAN: Dr. Marilynne HalstedBoland    CHIEF COMPLAINT: Abdominal pain, nausea, vomiting today.   HISTORY OF PRESENT ILLNESS: 69 year old Caucasian female with history of hypertension, chronic obstructive pulmonary disease presented to ED with above chief complaint. Patient is alert, awake, oriented in no acute distress but complains of abdominal pain, requesting pain medication. According to patient she started to have abdominal pain which is constant, 10/10, nonradiating, cannot describe the quality, associated with nausea, vomiting multiple times since 3:00 a.m. today. Patient denies any fever, chills. No headache or dizziness. No diarrhea or melena or bloody stool. No dysuria, hematuria. Patient denies any alcohol drinking but ate fried chicken last week. She said that she had gallbladder surgery but she didn't know if she had gallbladder stone or infection. She has chronic obstructive pulmonary disease which is stable at home. She was noted to have elevated lipase and admitted for acute pancreatitis.   PAST MEDICAL HISTORY:  1. Hypertension.  2. Chronic obstructive pulmonary disease.   SOCIAL HISTORY: Quit smoking in 1996. No alcohol drinking or illicit drugs.   FAMILY HISTORY: Father has diabetes, hypertension, stroke, heart attack. Mother had asthma.   PAST SURGICAL HISTORY: Cholecystectomy.   ALLERGIES: Doxycycline, ibuprofen, Levaquin.   HOME MEDICATIONS:  1. Advair. 2. Combivent. 3. Spiriva. 4. Ventolin.  5. Zofran p.r.n. q.6 hours p.r.n. for nausea.   REVIEW OF SYSTEMS: CONSTITUTIONAL: Patient denies any fever, chills. No headache or dizziness. No weakness. No weight loss. EYES: No blurred vision, double vision. No cataract. ENT: No hearing loss, epistaxis. No discharge. No postnasal drip. RESPIRATORY: No cough, sputum, shortness of  breath or hematemesis. CARDIOVASCULAR: No chest pain, palpitation, orthopnea, or nocturnal dyspnea. No edema. GASTROINTESTINAL: Positive for abdominal pain, nausea, vomiting but no diarrhea. No melena or bloody stools. GENITOURINARY: No dysuria, hematuria, or incontinence. ENDOCRINE: No polyuria, polydipsia. No heat or cold intolerance. HEMATOLOGY: No easy bruising or bleeding. NEUROLOGY: No syncope, loss of consciousness or seizure. PSYCH: No anxiety or depression.   PHYSICAL EXAMINATION:  VITAL SIGNS: Temperature 98.1, blood pressure 150/85, pulse 135, respirations 22, oxygen saturation 98%, pain scale 10/10.   GENERAL: Patient is alert, awake, oriented, in no acute distress.   HEENT: Pupils round, equal, reactive to light, accommodation. Moist oral mucosa. Clear oropharynx.   NECK: Supple. No JVD or carotid bruit. No lymphadenopathy. No thyromegaly.   CARDIOVASCULAR: S1, S2 regular rate, rhythm. No murmurs, gallops.   PULMONARY: Bilateral air entry. No wheezing or rales.   ABDOMEN: Soft, tenderness in upper abdomen. No guarding. No rebound. Bowel sounds present. No organomegaly.   EXTREMITIES: No edema, clubbing, or cyanosis. No calf tenderness. Strong bilateral pedal pulses.   NEUROLOGY: Alert and oriented x3. No focal deficit. Deep tendon reflexes 2+. Power 5/5. Sensation intact.   SKIN: No rash or jaundice.   LABORATORY, DIAGNOSTIC AND RADIOLOGICAL DATA: Abdominal ultrasound initial survey shows pancreas is partially visualized suggesting pancreatitis. Prior cholecystectomy. Mild dilatation of common bile duct likely related to prior cholecystectomy. Abdominal three way showed nonobstructive bowel gas pattern. Urinalysis negative. WBC 7.6, hemoglobin 11.6, platelets 338, lipase 1297, glucose 129, BUN 15, creatinine 1.01, sodium 143, potassium 3.2, chloride 102, bicarbonate 26, troponin 0.04.   IMPRESSION: 1. Acute pancreatitis.  2. Hypokalemia.  3. Chronic obstructive pulmonary  disease.  4. Hypertension.   PLAN OF TREATMENT:  1. Patient will be  admitted to medical floor. Will keep n.p.o., IV fluid support and pain management with morphine p.r.n. and Zofran p.r.n. for nausea, vomiting.  2. For hypokalemia, will give potassium and follow-up BMP, magnesium level.  3. Will follow up lipid panel and hemoglobin A1c.  4. GI and deep vein thrombosis prophylaxis.   Discussed patient's situation and plan of treatment with patient and patient's daughter.   TIME SPENT: About 60 minutes.   ____________________________ Shaune Pollack, MD qc:cms D: 02/11/2012 16:46:26 ET T: 02/12/2012 06:13:51 ET JOB#: [id number]  cc: Shaune Pollack, MD, <Dictator> Myrle Sheng. Katrinka Blazing, MD Shaune Pollack MD ELECTRONICALLY SIGNED 02/13/2012 16:40

## 2015-04-11 NOTE — Discharge Summary (Signed)
PATIENT NAME:  Sabrina [name], Sabrina [name] MR#:  [id number] DATE OF BIRTH:  February 04, 1946  DATE OF ADMISSION:  02/11/2012 DATE OF DISCHARGE:  02/21/2012  Please see Dr. Bunnie PhilipsSangeeta Panwar's dictated discharge summary on 02/16/2012 for more details. Please add the following discharge diagnoses to that.   DISCHARGE DIAGNOSES:  1. Acute pancreatitis, now resolved.  2. Acute respiratory failure likely due to pneumonia, improving on antibiotic.  3. Hypokalemia/hypomagnesemia, repleted and resolved.  4. Hyperglycemia with Hemoglobin A1c of 6.5. Recommended diabetic diet.  5. Anemia likely of chronic disease with hemoglobin of 8.4 status post 1 unit of packed red blood cell transfusion, remained stable. 6. Anasarca. Recommended low dose Lasix.  7. Bacterial pneumonia, on Zithromax and linezolid per Infectious Disease, Dr. Leavy CellaBlocker. 8. Elevated troponin likely due to tachycardia and supply/demand ischemia.  9. Paroxysmal atrial tachycardia, sinus tachycardia, possibly due to underlying lung condition. Started on metoprolol. Remains in normal sinus rhythm.  10. Hypertension, transient, now blood pressure is improved and stable. 11. Acute renal failure, likely prerenal, now resolved.  12. Skin lesion, not infected. No suggestion of acute staph infection. No need for specific therapy. Likely only from excoriation.   SECONDARY DIAGNOSES:  1. Hypertension.  2. Chronic obstructive pulmonary disease.   CONSULTATIONS:  1. Orson AloeMichael Blocker, MD - Infectious Disease.  2. Physical Therapy.   PROCEDURES/RADIOLOGY: Please see Dr. Bunnie PhilipsSangeeta Panwar's dictated discharge summary for course from admission to 02/16/2012.  Subsequent procedures/radiology are as follows: CT scan of the abdomen and pelvis without contrast on 02/16/2012 showed no definite inflammatory change associated with pancreatic body or tail. No splenomegaly. Bilateral pleural effusions versus pneumonia, at the right lower lobe. Small amount of ascites.   Chest x-ray  on 02/17/2012 showed mild right lower lobe infiltrate consistent with pneumonia.   CT scan of the chest for PE protocol, on 02/18/2012, showed pneumonia in the left upper lobe. Small bilateral pleural effusions.   2-D echocardiogram, 02/19/2012, showed normal LV systolic function. Ejection fraction 65%. Mildly dilated left atrium. Mildly dilated right atrium. Moderate to severe mitral and tricuspid regurgitation.   MAJOR LABORATORY PANEL: MRSA screen was positive. Wound culture from the back of her shoulder grew light growth of MRSA.   Hemoccult stool was negative.   HISTORY AND SHORT HOSPITAL COURSE: Please see Dr. Lum BabeSangeeta Panwar's discharge summary from 02/16/2012 for details from admission until 02/16/2012. Until then the patient was being treated for possible pancreatitis. Subsequently, she started having some shortness of breath and was found to have tachycardia and cardiac arrhythmias along with borderline elevated troponin which was thought to be due to sinus tach and arrhythmias along with possible supply/demand ischemia. Cardiology consult was obtained with Dr. Lady GaryFath who recommended 2-D echocardiogram, which was obtained with results dictated above. He also recommended starting on low dose metoprolol to help with rate control. The patient was found to have pneumonia for which she was started on Rocephin and Zithromax. Subsequently consult was obtained with Dr. Leavy CellaBlocker from infectious disease as her MRSA screen came back positive and he recommended treating her with Zyvox along with Zithromax for possible atypical coverage. She has been doing much better. She has been oxygenating well on room air and is stable enough to be discharged back to rehab placement based on physical therapy recommendation as she was weak. She and her son accepted a bed at [facility] where she is being discharged.  DISCHARGE PHYSICAL EXAMINATION:   VITALS: On the date of discharge her temperature is 98.5, heart  rate 82  per minute, respirations 20 per minute, blood pressure 133/78 mmHg, and she is saturating 98% on room air.   CARDIOVASCULAR: S1, S2 normal. No murmurs, rubs, or gallops.   LUNGS: Clear to auscultation bilaterally. No wheezing, rales, rhonchi, or crepitation.   ABDOMEN: Soft, benign.   NEUROLOGIC: Nonfocal examination.   SKIN: The patient has a dressing present in the mid back underlying where she had partial-thickness wound measuring 2 x 2 cm with mild erythema and moderate serous discharge. No signs of infection seen. All other physical examination remained at baseline.   DISCHARGE MEDICATIONS:  1. Advair 250/50 one puff twice a day. 2. Spiriva once daily.  3. Combivent 2 puffs every four hours as needed.  4. Xopenex/ipratropium nebulizer every six hours as needed.  5. Tylenol 650 mg p.o. every six hours as needed.  6. Zofran 4 mg p.o. every six hours as needed.  7. Prednisone 40 mg p.o. daily, taper by 10 mg daily until finished.  8. Protonix 40 mg p.o. daily.  9. Zithromax 500 mg p.o. daily for three more days.  10. Lasix 20 mg p.o. daily. 11. Zyvox 600 mg p.o. twice a day for five days.  12. Metoprolol 25 mg p.o. daily.   DISCHARGE DIET: Low sodium, 1800 ADA, low fat, low cholesterol.   DISCHARGE ACTIVITY: As tolerated.   DISCHARGE INSTRUCTIONS AND FOLLOW UP: The patient was instructed to follow-up with her primary care physician, Dr. Nilda Simmer, in 1 to 2 weeks. She will get physical therapy evaluation and management while at the facility.        She was instructed to keep her wound dressing changed every other day with normal saline or Saf-Clens spray, pat dry and apply a clean dressing.   TOTAL TIME DISCHARGING THIS PATIENT: 55 minutes. ____________________________ Ellamae Sia. Sherryll Burger, MD vss:slb D: 02/21/2012 13:30:53 ET T: 02/21/2012 13:55:12 ET JOB#: [id number]  cc: Tiffancy Moger S. Sherryll Burger, MD, <Dictator> Myrle Sheng. Katrinka Blazing, MD Rosalyn Gess Blocker, MD Darlin Priestly. Lady Gary,  MD Patricia Pesa MD ELECTRONICALLY SIGNED 02/22/2012 13:13

## 2015-04-11 NOTE — Consult Note (Signed)
PATIENT NAME:  Sabrina [name], Sabrina [name] MR#:  [id number] DATE OF BIRTH:  1946/11/14  DATE OF CONSULTATION:  03/20/2012  REFERRING PHYSICIAN:  Dr. Rudene Re   CONSULTING PHYSICIAN:  Rosalyn Gess. Donaven Criswell, MD  REASON FOR CONSULTATION: Possible sepsis.   HISTORY OF PRESENT ILLNESS: The patient is a 69 year old white female with a past medical history significant for chronic obstructive pulmonary disease and bacterial meningitis who was recently admitted for pancreatitis and had MRSA in a skin wound and was recently treated for pseudomonas urinary tract infection, who was admitted with a several-day history of dysuria, abdominal pain, fevers, chills, nausea, and vomiting. The patient had been discharged after her last hospitalization on March 06 to a skilled nursing facility. She had begun complaining of urinary tract symptoms and a urine culture was obtained on 03/28. The culture grew greater than 100,000 CFUs per mL of Pseudomonas aeruginosa. Yeast was also present. The Pseudomonas was pansensitive. A urinalysis was performed, but the complete urinalysis was not sent along with the rest of her paperwork. Her white count on 04/01 was 7.5 with an ANC of 5. She was given gentamicin for several days as monotherapy for the pseudomonas urinary tract infection. Her symptoms continued to worsen and she was subsequently admitted to the hospital. On her on admission she was found to be hypotensive and was placed in the Intensive Care Unit. She was given fluid boluses and initially placed on pressors. She is currently off the pressors and mentating fairly well. She is a somewhat poor historian but is awake and alert. She states that she still is having nausea and vomiting. She denies any rashes. She has not had any significant change in cough or shortness of breath. She has not had any change in her bowels. She has had  some dysuria as mentioned above. She is currently on azithromycin, gentamicin, and ceftazidime.   ALLERGIES:  Doxycycline, ibuprofen, and levofloxacin.   PAST MEDICAL HISTORY:  1. Bacterial meningitis.  2. Chronic obstructive pulmonary disease.  3. Hypertension.  4. Recent pancreatitis.  5. Recent skin lesions that were positive for MRSA. However, at the time these did not appear to be acutely infected.  6. Recent pseudomonas urinary tract infection.   SOCIAL HISTORY: She is a prior smoker. She does not drink. She has no history of injecting drug use. She most recently has been residing at a skilled nursing facility.   FAMILY HISTORY: Positive for diabetes, hypertension, stroke, heart attack, and asthma.  REVIEW OF SYSTEMS:  GENERAL: Positive for fevers, chills, and malaise. HEENT: No headaches. No sinus congestion. No sore throat. NECK: No stiffness. No swollen glands.  RESPIRATORY: No cough. No significant sputum production. No wheezing. Occasional shortness of breath, but no worse than her baseline. CARDIAC: No chest pains or palpitations.  GI: Positive nausea and vomiting, some mid abdominal pain, no change in her bowels. GENITOURINARY: Positive dysuria at times and inability to void. MUSCULOSKELETAL: No complaints. NEUROLOGIC: No focal weakness. PSYCHIATRIC: No complaints. SKIN: No new lesions.   PHYSICAL EXAMINATION:  VITAL SIGNS: T-max 98.7, pulse 124, blood pressure 77/43. 90% on 4 liters. On the monitor her systolic blood pressure most recently was 100.   GENERAL:  69 year old thin, white female in no acute distress.   HEENT: Normocephalic, atraumatic. Pupils equal and reactive to light. Extraocular motion intact. Sclerae, conjunctivae, and lids are without evidence for emboli or petechiae. Oropharynx shows no erythema or exudate. Gums are in fair condition.   NECK: Supple. Full range of motion. Midline  trachea. No lymphadenopathy.   LUNGS: Clear to auscultation bilaterally with good air movement. No focal consolidation.   HEART: Regular rate and rhythm without murmur, rub, or gallop.    ABDOMEN: Soft, nontender, and nondistended. No hepatosplenomegaly. No hernias noted. No CVA tenderness.   MUSCULOSKELETAL: No evidence for tenosynovitis.   SKIN: She has some erythema over the sacral area with some superficial ulcerations consistent with stage II pressure ulcers. There are some areas of erythema and scar at the shoulder blades consistent with stage I pressure ulcers as well as healed areas of ulceration. There were no new lesions and no purulent lesions. She had no other rashes. There were no stigmata of endocarditis, specifically no Janeway lesions or Osler nodes. Nail beds were without flame hemorrhages.   NEUROLOGIC: The patient was awake and interactive. She was mildly confused at times but was able to provide a fairly decent history.   PSYCHIATRIC: Mood and affect appeared normal.     LABORATORY DATA: BUN 82, creatinine 2.48.  Creatinine on initial presentation to the emergency was 3.25. Creatinine on 03/06 at the time of her discharge was 1.06. Her bicarbonate is 17. Anion gap is 13. LFTs were within normal limits. White count is 9.3 with a hemoglobin of 8.6, platelet count 253. No differential has been obtained. A urinalysis has 2+ blood, negative nitrites, 3+ leukocyte esterase, 29 red cells, 654 white cells, less than one epithelial cell. Urine culture is pending. Blood cultures are also pending. A chest x-ray shows no acute infiltrate.   IMPRESSION: This is a 69 year old white female with a history of chronic obstructive pulmonary disease, bacterial meningitis, and recent admission for pancreatitis and MRSA skin lesions, recent outpatient pseudomonas urinary tract infection who was admitted with possible sepsis with pyuria suggestive of urinary tract infection and new onset of renal insufficiency.   RECOMMENDATIONS:  1. Her blood cultures are currently pending. She could be septic but she could also be hypovolemic. She has received bolus IV fluids and pressors.  Currently, her blood pressures is 100 systolic off pressors. She is mentating fairly well. Would recommend continued hydration and use pressors as needed.  2. She received IV gentamicin as an outpatient. Despite this, she developed chills, nausea, and vomiting. Her renal insufficiency is likely due to hypovolemia but the aminoglycoside is also nephrotoxic. She does not clinically appear to have pneumonia. Her chest x-ray is negative. This may be due to hypovolemia but her sats are doing well.  3. Her skin lesions appear to have healed quite well. She has some areas of stage II pressure ulceration. There are no signs of infection but on her last hospitalization cultures grew MRSA. She is not currently being treated for MRSA.  4. We will change her antibiotics to meropenem and vancomycin. I will have pharmacy dose this based on her renal function. We will await urine and blood cultures. We will follow her and white count.  5. Would maintain contact isolation.    TIME SPENT: Approximately 40 minutes were spent providing critical care to this patient.    ____________________________ Rosalyn GessMichael E. Bryah Ocheltree, MD meb:bjt D: 03/20/2012 15:04:07 ET T: 03/20/2012 15:40:10 ET JOB#: [id number]  cc: Rosalyn GessMichael E. Taji Sather, MD, <Dictator> Ovie Cornelio E Laverne Hursey MD ELECTRONICALLY SIGNED 03/21/2012 13:17

## 2015-04-11 NOTE — Consult Note (Signed)
Dr. Marva PandaSkulskie to cover tomorrow and this weekend and will not see patient unless they start bleeding and if so please call him.  Electronic Signatures: Scot JunElliott, Creedon Danielski T (MD)  (Signed on 04-Apr-13 13:48)  Authored  Last Updated: 04-Apr-13 13:48 by Scot JunElliott, Bryton Romagnoli T (MD)

## 2015-04-11 NOTE — Consult Note (Signed)
PATIENT NAME:  Sabrina [name], Sabrina [name] MR#:  [id number] DATE OF BIRTH:  Jul 03, 1946  DATE OF CONSULTATION:  03/26/2012  CONSULTING PHYSICIAN:  Marcina MillardAlexander Vaneza Pickart, MD PRIMARY CARE PHYSICIAN: Nilda SimmerKristi Smith, MD   CHIEF COMPLAINT: Fever and chills.   HISTORY OF PRESENT ILLNESS: The patient is a 69 year old female admitted 03/20/2012 with septic shock, pneumonia, and possible underlying pancreatitis. During the hospitalization, the patient has been restless, anxious, requiring respiratory support on a ventilator. The patient has also been tachycardic in sinus rhythm. Most recent echocardiogram 03/21/2012 revealed normal left ventricular function with LVF of 65% with severe mitral and tricuspid regurgitation. Her hospitalization has also been complicated by acute on chronic renal failure.   PAST MEDICAL HISTORY:  1. Hypertension.  2. Chronic obstructive pulmonary disease.  3. Recent hospitalization for acute pericarditis and respiratory failure secondary to pneumonia.   MEDICATIONS ON ADMISSION:  1. Metoprolol 25 mg daily.  2. Furosemide 20 mg daily.  3. K-Dur 20 mEq daily.  4. Advair 250/50 b.i.d.  5. Spiriva 1 inhalation daily.  6. Combivent inhaler. 7. Protonix 40 mg daily.  8. MS Contin 15 mg t.i.d.  9. Norco one every 4 hours p.r.n.  10. Mylanta 30 mL p.r.n.  11. Zoloft 50 mg daily.  12. Vitamin [name] 500 mg daily.   SOCIAL HISTORY: The patient currently resides in a nursing home.   FAMILY HISTORY: Remote tobacco abuse history.   REVIEW OF SYSTEMS: Review of systems is not obtainable. The patient is currently intubated and sedated.   PHYSICAL EXAMINATION:  VITAL SIGNS: Blood pressure 111/58, pulse 128, respirations 11, pulse oximetry 100%. Temperature 98.4.   HEENT: The patient is currently intubated and sedated.   LUNGS: Clear.   HEART: Normal jugular venous pressure. Normal point of maximal impulse. Tachycardia. Normal S1, S2. No appreciable gallop, murmur, or rub.   ABDOMEN: Soft and  nontender. Pulses were intact bilaterally.   MUSCULOSKELETAL: Normal muscle tone.   NEUROLOGICAL: The patient is intubated and heavily sedated.   IMPRESSION: A 69 year old female who presents with septic shock, pneumonia, possible pancreatitis, urinary tract infection, with respiratory failure with history of chronic obstructive pulmonary disease. The patient is tachycardic, however, she is in sinus rhythm.   RECOMMENDATIONS:  1. I agree with overall current therapy.  2. I agree with intermittent intravenous metoprolol. The patient is currently intubated and unable to take oral medications. Sinus tachycardia likely is secondary to multiple comorbidities including anxiety.  3. No further recommendations at this time.  ____________________________ Marcina MillardAlexander Latriece Anstine, MD ap:cbb D: 03/26/2012 09:51:54 ET T: 03/26/2012 10:32:13 ET JOB#: [id number]  cc: Marcina MillardAlexander Alaena Strader, MD, <Dictator> Marcina MillardALEXANDER Tremon Sainvil MD ELECTRONICALLY SIGNED 04/03/2012 8:47

## 2015-04-11 NOTE — Consult Note (Signed)
Comments   CT chest reviewed from 02/18/12 with Dr Mortimer Fries and Dr Phifer who agree lung nodule present on imaging could be malignancy and would explain some of pt's decline over past months. Discussed with family (son and daughter) that pt remains critically ill and that malignancy cannot be excluded from ddx. They ask about workup which in my opinion could not be considered unless pt stabilized. Her performance status was quite poor. Family now considering if to leave on vent further or take off. They are discussing plan together and will let medical team know of decision.  20 minutes  Electronic Signatures for Addendum Section:  Phifer, Izora Gala (MD) (Signed Addendum 10-Apr-13 22:34)  Billey Chang, NP, and I met with pt's family. They understand the gravity of pt's illness and that she may not survive this illness. Will see how she does over next 24-48 hrs and reassess continued aggressive medical care.   Electronic Signatures: Borders, Kirt Boys (NP)  (Signed 10-Apr-13 14:25)  Authored: Palliative Care   Last Updated: 10-Apr-13 22:34 by Phifer, Izora Gala (MD)

## 2015-04-11 NOTE — Consult Note (Signed)
PATIENT NAME:  Sabrina [name], [name] MR#:  [id number] DATE OF BIRTH:  01-03-1946  DATE OF CONSULTATION:  03/21/2012  REFERRING PHYSICIAN:  Dr. Rudene Rearwish CONSULTING PHYSICIAN:  Scot Junobert T. Robin Petrakis, MD  HISTORY OF PRESENT ILLNESS:  The patient is a 69 year old white female who was previously admitted to the hospital on 02/11/2012, discharged 02/21/2012 with acute pancreatitis, anemia of chronic disease, anasarca, bacterial pneumonia treated with Zithromax and linezolid, PAT, hypertension, acute renal failure, resolved. She went to a nursing home. However, she has become again quite ill and had been vomiting for a week or more, had vague lower abdominal discomfort in the periumbilical area and suprapubic area along with fever and chills. She was seen in the ER, found to be hypotensive, and had a positive urine and suspicion for pneumonia. She was admitted to the hospital.   Since hospitalization she has been on various antibiotics. Dr. Leavy CellaBlocker has seen her in consultation. She has been on Levophed drip. She has had hydration and her hemoglobin with this hydration has dropped dramatically from 8.6 yesterday to 5.9 and I was asked to see her in consultation for anemia, possibly of GI origin.   The patient at this time is unconscious and history and review of systems is not obtainable at this time.   PAST MEDICAL HISTORY:  1. Chronic obstructive pulmonary disease.  2. Acute pancreatitis.  3. Respiratory failure.  4. Hypertension.  5. Renal insufficiency due to dehydration.   SOCIAL HABITS: She is divorced, lives at a nursing home. Ex-chronic smoker, quit in 1996.   FAMILY HISTORY: Positive for diabetes, coronary artery disease, and asthma in her mother.   ADMISSION MEDICATIONS:  1. Advair 250/50 twice a day.  2. Spiriva one inhalation a day.  3. Combivent inhaler.  4. Protonix 40 mg a day.  5. Metoprolol 25 mg a day.  6. MS Contin 15 mg twice a day.  7. Norco 5/325 q. 4 hours p.r.n. for pain.   8. Furosemide 20 mg a day.  9. K-Dur 20 milliequivalents a day.  10. Mylanta 30 mL p.r.n.  11. Zoloft 50 mg a day.   ALLERGIES: Levaquin, doxycycline, and ibuprofen.  PHYSICAL EXAMINATION: VITAL SIGNS:  On admission to the ER, blood pressure systolic was 80. After hydration it came up to 94/56.   GENERAL APPEARANCE: Elderly female who looks older than stated age, very pale, lying in bed very sedated.  HEENT: Sclerae anicteric. Conjunctivae pale.  Head is atraumatic.  Trachea is in the midline. There are no carotid bruits. She has an internal jugular catheter in the right.  CHEST: No crackles in the anterior fields.   HEART: No murmurs or gallops I can hear.   ABDOMEN: The abdomen is soft and nondistended. No hepatosplenomegaly. No masses. No bruits.   EXTREMITIES: Below-the-knee stockings on, trace edema.   RECTAL: Exam was done at the bedside. There was slightly tan-orange flecks of mucus and stool, which was heme negative when tested at the bedside.    LABORATORY DATA: Urine was positive with white cells and leukocyte esterase. Initial white count was 9000. Initial BUN 92, creatinine 3.2. Urine culture on 03/18/2011 showed 100,000 Pseudomonas.    ASSESSMENT:  1. Anemia of chronic disease. At this time there is no evidence of acute gastrointestinal bleeding. Stool specimen done earlier today was negative. My rectal exam showed no blood whatsoever. Her anemia likely is from chronic disease. Cannot rule out vitamin deficiencies. I recommend PPI once a day to lessen the chance of stress  ulceration.  2. Recommend hematology-oncology consult for further evaluation of her anemia.  3. Given her illness, she may have some degree of ileus. I would consider switching the pantoprazole at this time from tablet to intravenous form and I will write for that order. No plans for endoscopy at this time. Her last colonoscopy was done by Dr. Niel Hummer in 2011 and showed sigmoid diverticulosis and three  small 8-millimeter or less polyps. I do not find any evidence of an upper endoscopy in her hospital electronic medical record. It is also of note, however, that on her last admission her hemoglobin dropped from 11.6 to 7.7 with hydration and treatment of her pancreatitis.    ____________________________ Scot Jun, MD rte:bjt D: 03/21/2012 13:40:44 ET T: 03/21/2012 14:17:24 ET JOB#: [id number]  cc: Scot Jun, MD, <Dictator> Lurline Del, MD Rosalyn Gess. Blocker, MD Scot Jun MD ELECTRONICALLY SIGNED 04/01/2012 11:56
